# Patient Record
Sex: Female | Born: 1985 | Race: White | Hispanic: No | Marital: Married | Smoking: Former smoker
Health system: Southern US, Community
[De-identification: ages and names within clinical notes are randomized; demographics above are authoritative.]

## PROBLEM LIST (undated history)

## (undated) DIAGNOSIS — G43909 Migraine, unspecified, not intractable, without status migrainosus: Secondary | ICD-10-CM

## (undated) DIAGNOSIS — F32A Depression, unspecified: Secondary | ICD-10-CM

## (undated) DIAGNOSIS — F329 Major depressive disorder, single episode, unspecified: Secondary | ICD-10-CM

## (undated) DIAGNOSIS — F1920 Other psychoactive substance dependence, uncomplicated: Secondary | ICD-10-CM

---
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
## 2003-08-09 ENCOUNTER — Other Ambulatory Visit: Admission: RE | Admit: 2003-08-09 | Discharge: 2003-08-09 | Payer: Self-pay | Admitting: Obstetrics and Gynecology

## 2003-08-10 ENCOUNTER — Other Ambulatory Visit: Admission: RE | Admit: 2003-08-10 | Discharge: 2003-08-10 | Payer: Self-pay | Admitting: Obstetrics and Gynecology

## 2003-12-02 ENCOUNTER — Ambulatory Visit: Payer: Self-pay | Admitting: Psychiatry

## 2003-12-02 ENCOUNTER — Inpatient Hospital Stay (HOSPITAL_COMMUNITY): Admission: RE | Admit: 2003-12-02 | Discharge: 2003-12-07 | Payer: Self-pay | Admitting: Psychiatry

## 2003-12-02 ENCOUNTER — Emergency Department (HOSPITAL_COMMUNITY): Admission: EM | Admit: 2003-12-02 | Discharge: 2003-12-02 | Payer: Self-pay | Admitting: Emergency Medicine

## 2003-12-09 ENCOUNTER — Ambulatory Visit: Payer: Self-pay | Admitting: Family Medicine

## 2003-12-10 ENCOUNTER — Ambulatory Visit: Payer: Self-pay | Admitting: Internal Medicine

## 2004-01-10 ENCOUNTER — Ambulatory Visit: Payer: Self-pay | Admitting: Internal Medicine

## 2004-02-23 ENCOUNTER — Ambulatory Visit: Payer: Self-pay | Admitting: Internal Medicine

## 2004-04-03 ENCOUNTER — Ambulatory Visit: Payer: Self-pay | Admitting: Internal Medicine

## 2004-05-29 ENCOUNTER — Ambulatory Visit: Payer: Self-pay | Admitting: Internal Medicine

## 2004-09-12 ENCOUNTER — Other Ambulatory Visit: Admission: RE | Admit: 2004-09-12 | Discharge: 2004-09-12 | Payer: Self-pay | Admitting: Obstetrics and Gynecology

## 2004-09-13 ENCOUNTER — Other Ambulatory Visit: Admission: RE | Admit: 2004-09-13 | Discharge: 2004-09-13 | Payer: Self-pay | Admitting: Obstetrics and Gynecology

## 2004-11-16 ENCOUNTER — Ambulatory Visit: Payer: Self-pay | Admitting: Internal Medicine

## 2004-12-01 ENCOUNTER — Ambulatory Visit: Payer: Self-pay | Admitting: Internal Medicine

## 2004-12-09 ENCOUNTER — Inpatient Hospital Stay (HOSPITAL_COMMUNITY): Admission: AD | Admit: 2004-12-09 | Discharge: 2004-12-12 | Payer: Self-pay | Admitting: Psychiatry

## 2004-12-10 ENCOUNTER — Ambulatory Visit: Payer: Self-pay | Admitting: Psychiatry

## 2005-01-11 ENCOUNTER — Ambulatory Visit: Payer: Self-pay | Admitting: Internal Medicine

## 2005-02-07 ENCOUNTER — Ambulatory Visit: Payer: Self-pay | Admitting: Family Medicine

## 2005-02-21 ENCOUNTER — Ambulatory Visit: Payer: Self-pay | Admitting: Internal Medicine

## 2005-03-02 ENCOUNTER — Ambulatory Visit: Payer: Self-pay | Admitting: Internal Medicine

## 2005-03-16 ENCOUNTER — Ambulatory Visit: Payer: Self-pay | Admitting: Internal Medicine

## 2005-05-11 ENCOUNTER — Ambulatory Visit: Payer: Self-pay | Admitting: Internal Medicine

## 2005-06-02 ENCOUNTER — Ambulatory Visit: Payer: Self-pay | Admitting: Family Medicine

## 2005-06-05 ENCOUNTER — Ambulatory Visit: Payer: Self-pay | Admitting: Internal Medicine

## 2005-07-12 ENCOUNTER — Ambulatory Visit: Payer: Self-pay | Admitting: Internal Medicine

## 2005-11-12 ENCOUNTER — Ambulatory Visit: Payer: Self-pay | Admitting: Internal Medicine

## 2005-12-18 ENCOUNTER — Ambulatory Visit: Payer: Self-pay | Admitting: Family Medicine

## 2006-05-03 ENCOUNTER — Ambulatory Visit: Payer: Self-pay | Admitting: Internal Medicine

## 2006-05-13 ENCOUNTER — Emergency Department (HOSPITAL_COMMUNITY): Admission: EM | Admit: 2006-05-13 | Discharge: 2006-05-13 | Payer: Self-pay | Admitting: Emergency Medicine

## 2006-07-20 ENCOUNTER — Ambulatory Visit: Payer: Self-pay | Admitting: Family Medicine

## 2006-08-30 ENCOUNTER — Telehealth: Payer: Self-pay | Admitting: Internal Medicine

## 2006-11-06 ENCOUNTER — Ambulatory Visit: Payer: Self-pay | Admitting: Family Medicine

## 2006-11-06 DIAGNOSIS — R109 Unspecified abdominal pain: Secondary | ICD-10-CM | POA: Insufficient documentation

## 2006-11-06 DIAGNOSIS — J309 Allergic rhinitis, unspecified: Secondary | ICD-10-CM | POA: Insufficient documentation

## 2006-11-06 DIAGNOSIS — F3289 Other specified depressive episodes: Secondary | ICD-10-CM | POA: Insufficient documentation

## 2006-11-06 DIAGNOSIS — L708 Other acne: Secondary | ICD-10-CM | POA: Insufficient documentation

## 2006-11-06 LAB — CONVERTED CEMR LAB
Bilirubin Urine: NEGATIVE
Glucose, Urine, Semiquant: NEGATIVE
Ketones, urine, test strip: NEGATIVE
Nitrite: NEGATIVE
Protein, U semiquant: NEGATIVE
Specific Gravity, Urine: <1.005
Urobilinogen, UA: 0.2
WBC Urine, dipstick: NEGATIVE
pH: 7.5

## 2006-11-11 ENCOUNTER — Telehealth: Payer: Self-pay | Admitting: Family Medicine

## 2006-11-20 ENCOUNTER — Inpatient Hospital Stay (HOSPITAL_COMMUNITY): Admission: AD | Admit: 2006-11-20 | Discharge: 2006-11-21 | Payer: Self-pay | Admitting: Obstetrics & Gynecology

## 2006-11-21 ENCOUNTER — Ambulatory Visit: Payer: Self-pay | Admitting: Family Medicine

## 2006-11-26 ENCOUNTER — Ambulatory Visit: Payer: Self-pay | Admitting: Cardiology

## 2007-03-04 ENCOUNTER — Ambulatory Visit: Payer: Self-pay | Admitting: Internal Medicine

## 2007-06-25 ENCOUNTER — Telehealth: Payer: Self-pay | Admitting: *Deleted

## 2007-08-08 ENCOUNTER — Ambulatory Visit: Payer: Self-pay | Admitting: Internal Medicine

## 2007-08-08 LAB — CONVERTED CEMR LAB
Ketones, urine, test strip: NEGATIVE
Nitrite: POSITIVE
Specific Gravity, Urine: <1.005

## 2007-08-09 ENCOUNTER — Encounter: Payer: Self-pay | Admitting: Internal Medicine

## 2007-08-09 LAB — CONVERTED CEMR LAB: Chlamydia, Swab/Urine, PCR: NEGATIVE

## 2007-08-11 ENCOUNTER — Ambulatory Visit: Payer: Self-pay | Admitting: Internal Medicine

## 2007-08-11 ENCOUNTER — Telehealth (INDEPENDENT_AMBULATORY_CARE_PROVIDER_SITE_OTHER): Payer: Self-pay | Admitting: *Deleted

## 2007-08-12 ENCOUNTER — Telehealth (INDEPENDENT_AMBULATORY_CARE_PROVIDER_SITE_OTHER): Payer: Self-pay | Admitting: *Deleted

## 2007-08-12 ENCOUNTER — Encounter: Admission: RE | Admit: 2007-08-12 | Discharge: 2007-08-12 | Payer: Self-pay | Admitting: Internal Medicine

## 2007-08-13 LAB — CONVERTED CEMR LAB
Basophils Absolute: 0.1 10*3/uL (ref 0.0–0.1)
Bilirubin, Direct: 0.1 mg/dL (ref 0.0–0.3)
Calcium: 9.3 mg/dL (ref 8.4–10.5)
Eosinophils Absolute: 0.2 10*3/uL (ref 0.0–0.7)
GFR calc Af Amer: 133 mL/min
GFR calc non Af Amer: 110 mL/min
HCT: 39.3 % (ref 36.0–46.0)
Hemoglobin: 13.6 g/dL (ref 12.0–15.0)
Lymphocytes Relative: 21.8 % (ref 12.0–46.0)
MCHC: 34.5 g/dL (ref 30.0–36.0)
Monocytes Absolute: 0.2 10*3/uL (ref 0.1–1.0)
Neutro Abs: 5.5 10*3/uL (ref 1.4–7.7)
RDW: 11.5 % (ref 11.5–14.6)
Sodium: 138 meq/L (ref 135–145)
Total Bilirubin: 0.8 mg/dL (ref 0.3–1.2)

## 2008-04-16 ENCOUNTER — Ambulatory Visit: Payer: Self-pay | Admitting: Internal Medicine

## 2008-04-16 DIAGNOSIS — J029 Acute pharyngitis, unspecified: Secondary | ICD-10-CM | POA: Insufficient documentation

## 2008-04-16 LAB — CONVERTED CEMR LAB: Rapid Strep: NEGATIVE

## 2008-06-03 ENCOUNTER — Ambulatory Visit: Payer: Self-pay | Admitting: Family Medicine

## 2008-07-14 ENCOUNTER — Telehealth: Payer: Self-pay | Admitting: Family Medicine

## 2008-08-01 IMAGING — CT CT PELVIS W/ CM
2 of 5 series · 17 of 46 positions shown, 19 images · IV contrast (omnipaque)
Comparison: None.

CLINICAL DATA: Abdominal pain, particularly right lower quadrant, some pelvic pain.
 ABDOMEN CT WITH CONTRAST:
TECHNIQUE: Multidetector CT imaging of the abdomen was performed following the standard protocol during bolus administration of intravenous contrast.
 Contrast:  799cc Omnipaque 300
TECHNIQUE: Multidetector CT imaging of the pelvis was performed following the standard protocol during bolus administration of intravenous contrast.

[Series 2: abd_pel 5.0 b30f st · axial · 0.64mm/px · z∈[-390,-16]mm · 14 of 85 slices shown, 16 images]
[im 5/85  soft-tissue]
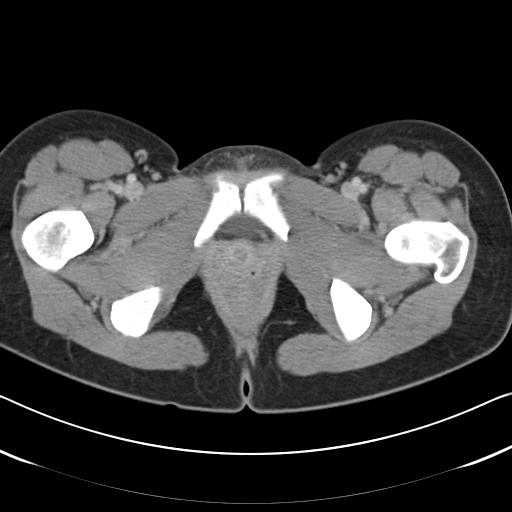
[im 5/85  bone]
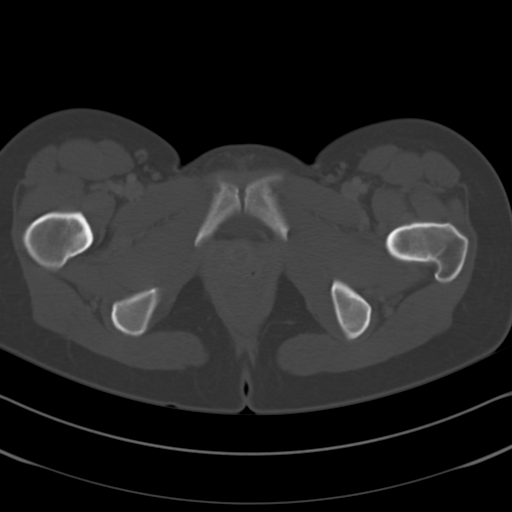
[im 13/85  soft-tissue]
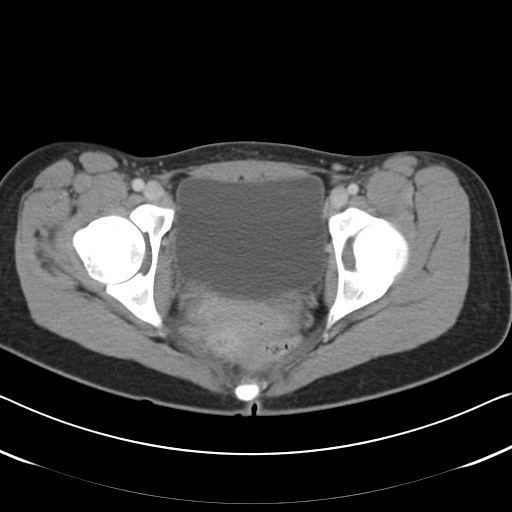
[im 17/85  soft-tissue]
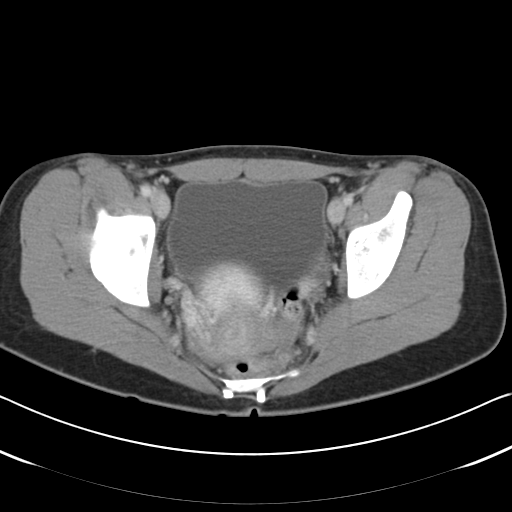
[im 22/85  soft-tissue]
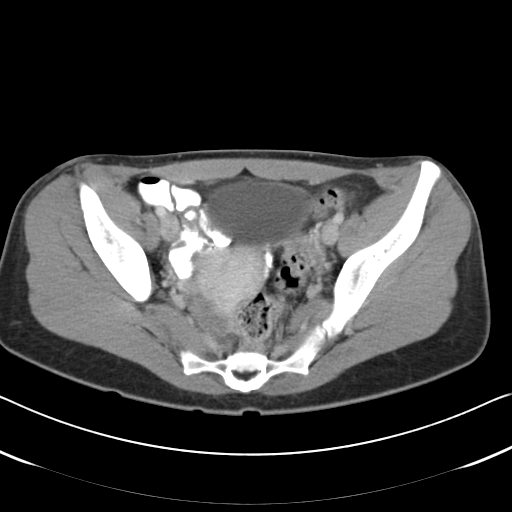
[im 30/85  soft-tissue]
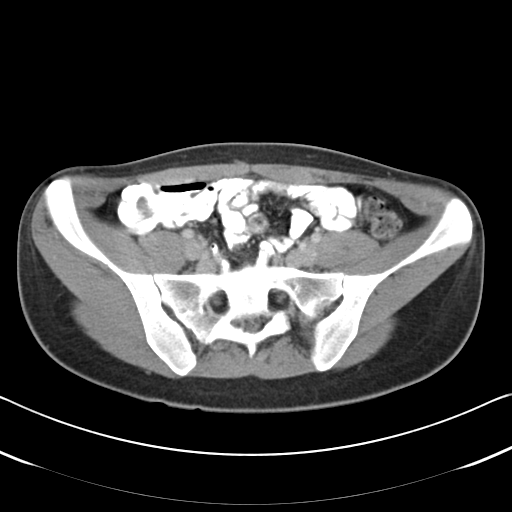
[im 34/85  soft-tissue]
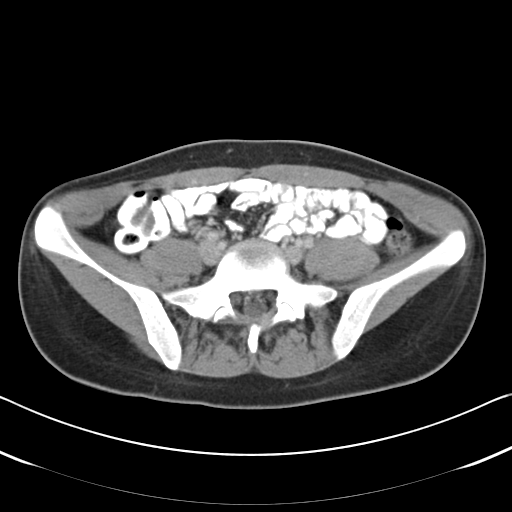
[im 38/85  soft-tissue]
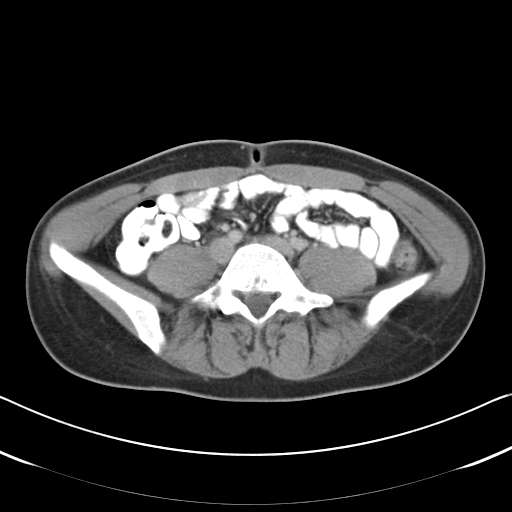
[im 47/85  soft-tissue]
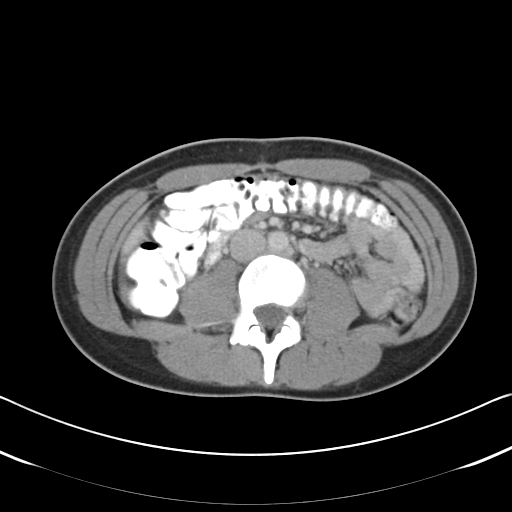
[im 51/85  soft-tissue]
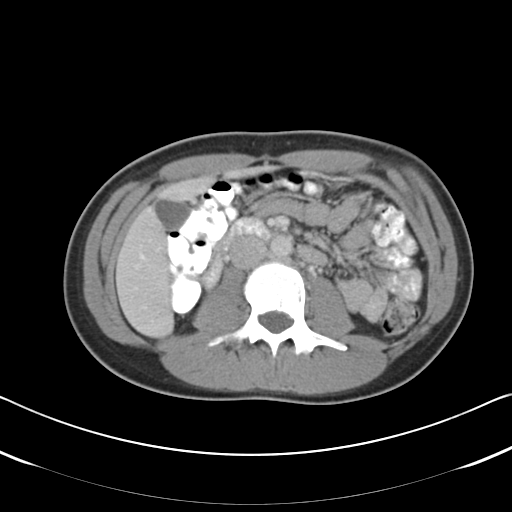
[im 51/85  bone]
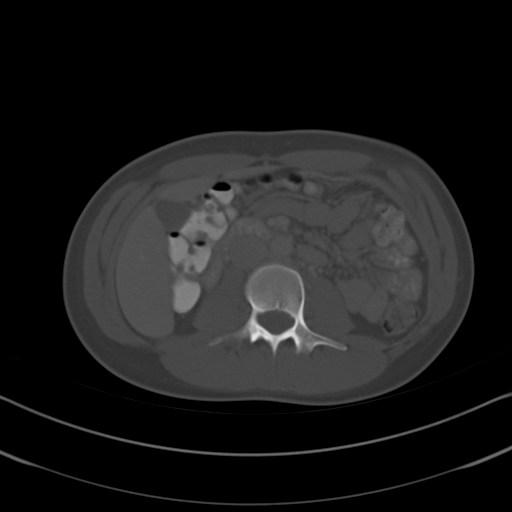
[im 55/85  soft-tissue]
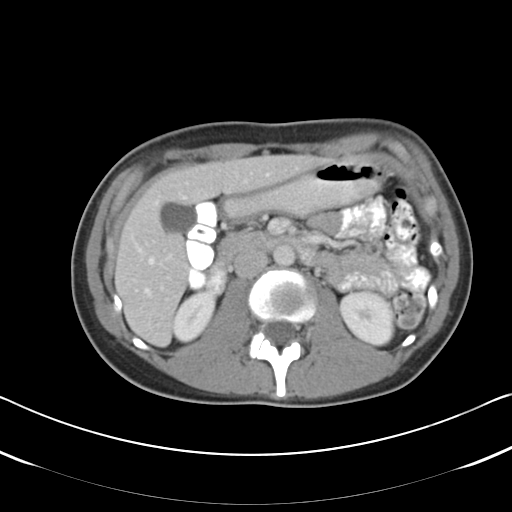
[im 64/85  soft-tissue]
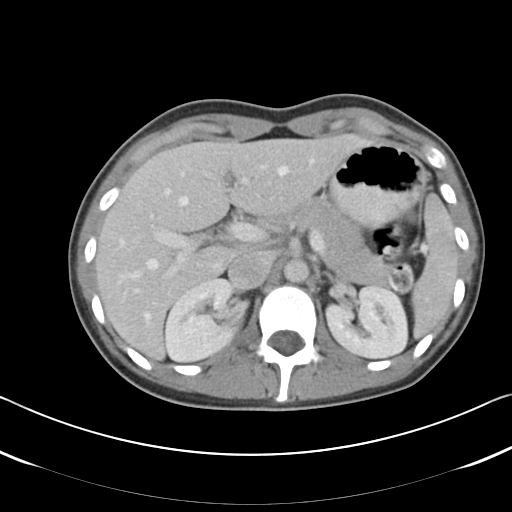
[im 68/85  soft-tissue]
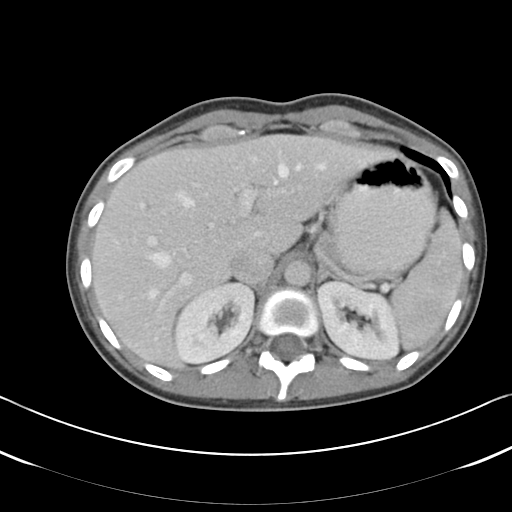
[im 72/85  soft-tissue]
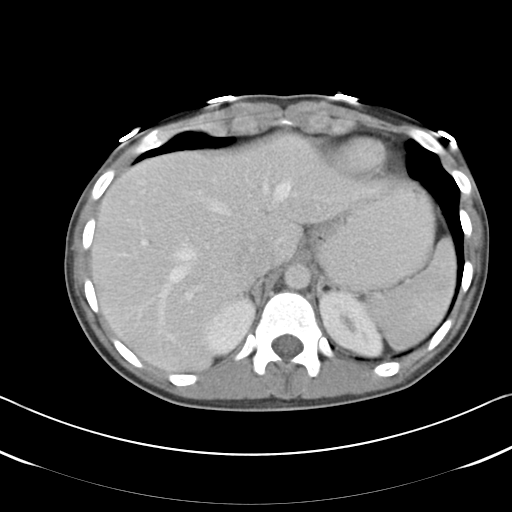
[im 80/85  soft-tissue]
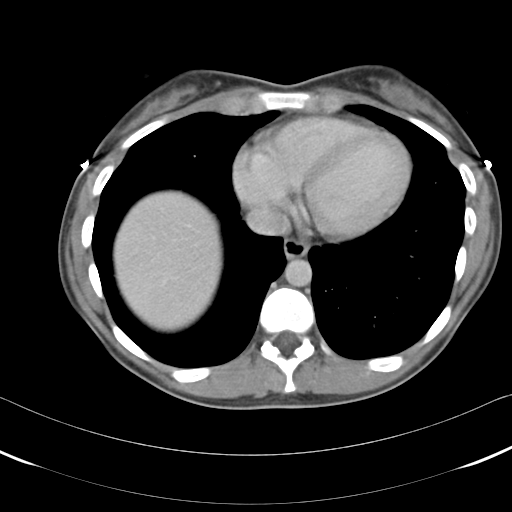

[Series 602: <mpr thick range> · coronal · 0.84mm/px · 3 of 63 slices shown]
[im 21/63  soft-tissue]
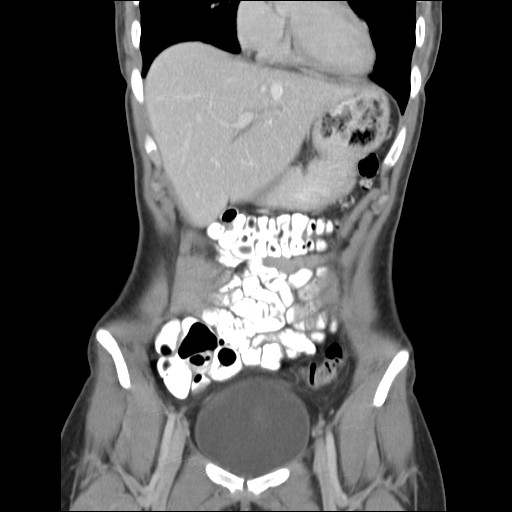
[im 28/63  soft-tissue]
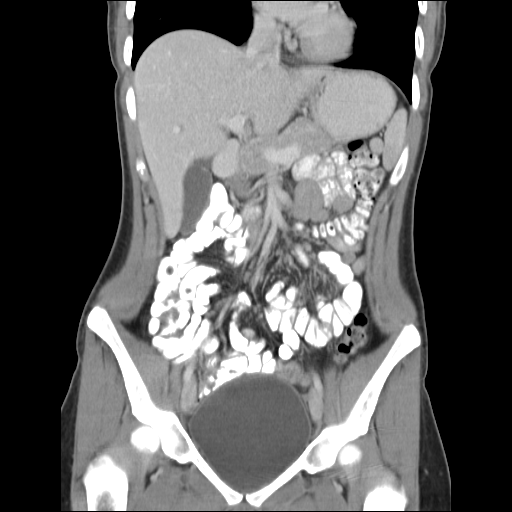
[im 35/63  soft-tissue]
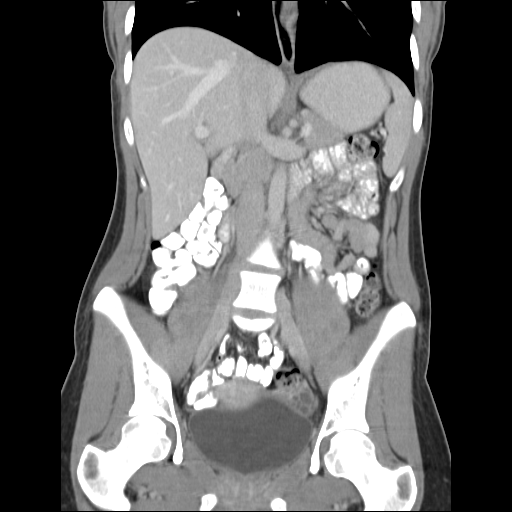

[17 of 46 positions shown; findings below may reference images not displayed]

FINDINGS: The lung bases are clear. The liver enhances with no focal abnormality and no ductal dilatation is seen. No calcified gallstones are noted. The pancreas is normal in size and the pancreatic duct is not dilated. The adrenal glands and spleen appear normal. The kidneys enhance and on delayed images, the pelvocaliceal systems appear normal. The abdominal aorta is normal in caliber.
IMPRESSION: Negative CT of the abdomen.
 PELVIS CT WITH CONTRAST:
FINDINGS: The appendix is relatively well seen low in the right bony pelvis, and appear normal.  There is no CT evidence of appendicitis.  Terminal ileum appears grossly normal as well. Urinary bladder is unremarkable. The uterus is normal in size. Multiple left ovarian follicles are present. There is some free fluid in the pelvis possibly due to recently ruptured ovarian cyst.
IMPRESSION: 1.  The appendix appears normal.
 2.  Small to moderate amount of fluid in pelvis possibly due to recently ruptured ovarian cyst.

## 2008-09-25 ENCOUNTER — Ambulatory Visit: Payer: Self-pay | Admitting: Internal Medicine

## 2008-09-25 LAB — CONVERTED CEMR LAB: Rapid Strep: NEGATIVE

## 2008-11-01 ENCOUNTER — Ambulatory Visit: Payer: Self-pay | Admitting: Internal Medicine

## 2008-11-01 DIAGNOSIS — R519 Headache, unspecified: Secondary | ICD-10-CM | POA: Insufficient documentation

## 2008-11-01 DIAGNOSIS — M542 Cervicalgia: Secondary | ICD-10-CM | POA: Insufficient documentation

## 2008-11-01 DIAGNOSIS — R51 Headache: Secondary | ICD-10-CM | POA: Insufficient documentation

## 2008-11-09 ENCOUNTER — Encounter: Payer: Self-pay | Admitting: Internal Medicine

## 2008-11-22 ENCOUNTER — Ambulatory Visit: Payer: Self-pay | Admitting: Internal Medicine

## 2008-11-22 DIAGNOSIS — IMO0002 Reserved for concepts with insufficient information to code with codable children: Secondary | ICD-10-CM | POA: Insufficient documentation

## 2008-11-24 ENCOUNTER — Telehealth: Payer: Self-pay | Admitting: Internal Medicine

## 2008-11-26 ENCOUNTER — Telehealth: Payer: Self-pay | Admitting: Internal Medicine

## 2008-11-30 ENCOUNTER — Telehealth: Payer: Self-pay | Admitting: Internal Medicine

## 2008-12-01 ENCOUNTER — Encounter: Admission: RE | Admit: 2008-12-01 | Discharge: 2009-01-19 | Payer: Self-pay | Admitting: Internal Medicine

## 2008-12-06 ENCOUNTER — Ambulatory Visit: Payer: Self-pay | Admitting: Internal Medicine

## 2008-12-06 DIAGNOSIS — J069 Acute upper respiratory infection, unspecified: Secondary | ICD-10-CM | POA: Insufficient documentation

## 2008-12-06 DIAGNOSIS — R300 Dysuria: Secondary | ICD-10-CM | POA: Insufficient documentation

## 2008-12-06 LAB — CONVERTED CEMR LAB
Bilirubin Urine: NEGATIVE
Ketones, urine, test strip: NEGATIVE
Nitrite: NEGATIVE
Specific Gravity, Urine: <1.005

## 2008-12-13 ENCOUNTER — Telehealth: Payer: Self-pay | Admitting: *Deleted

## 2008-12-13 ENCOUNTER — Ambulatory Visit: Payer: Self-pay | Admitting: Internal Medicine

## 2008-12-13 DIAGNOSIS — T50995A Adverse effect of other drugs, medicaments and biological substances, initial encounter: Secondary | ICD-10-CM | POA: Insufficient documentation

## 2008-12-13 LAB — CONVERTED CEMR LAB
Bilirubin Urine: NEGATIVE
Glucose, Urine, Semiquant: NEGATIVE
Urobilinogen, UA: 0.2
WBC Urine, dipstick: NEGATIVE

## 2008-12-14 ENCOUNTER — Encounter: Payer: Self-pay | Admitting: Internal Medicine

## 2009-01-26 ENCOUNTER — Encounter: Payer: Self-pay | Admitting: Internal Medicine

## 2009-02-17 ENCOUNTER — Ambulatory Visit: Payer: Self-pay | Admitting: Internal Medicine

## 2009-02-17 DIAGNOSIS — J050 Acute obstructive laryngitis [croup]: Secondary | ICD-10-CM | POA: Insufficient documentation

## 2009-02-22 ENCOUNTER — Telehealth: Payer: Self-pay | Admitting: *Deleted

## 2009-02-23 ENCOUNTER — Ambulatory Visit: Payer: Self-pay | Admitting: Internal Medicine

## 2009-03-03 ENCOUNTER — Telehealth: Payer: Self-pay | Admitting: Family Medicine

## 2009-03-03 ENCOUNTER — Ambulatory Visit: Payer: Self-pay | Admitting: Family Medicine

## 2009-03-04 LAB — CONVERTED CEMR LAB
Basophils Absolute: 0 10*3/uL (ref 0.0–0.1)
Eosinophils Absolute: 0.4 10*3/uL (ref 0.0–0.7)
Eosinophils Relative: 5.1 % — ABNORMAL HIGH (ref 0.0–5.0)
Lymphs Abs: 2.3 10*3/uL (ref 0.7–4.0)
MCV: 97.5 fL (ref 78.0–100.0)
Monocytes Absolute: 0.2 10*3/uL (ref 0.1–1.0)
Neutrophils Relative %: 64.9 % (ref 43.0–77.0)
Platelets: 379 10*3/uL (ref 150.0–400.0)
RDW: 12.1 % (ref 11.5–14.6)
WBC: 8.3 10*3/uL (ref 4.5–10.5)

## 2009-03-21 ENCOUNTER — Encounter: Payer: Self-pay | Admitting: Internal Medicine

## 2009-04-11 ENCOUNTER — Ambulatory Visit: Payer: Self-pay | Admitting: Internal Medicine

## 2009-04-11 DIAGNOSIS — R1031 Right lower quadrant pain: Secondary | ICD-10-CM | POA: Insufficient documentation

## 2009-04-11 DIAGNOSIS — R112 Nausea with vomiting, unspecified: Secondary | ICD-10-CM | POA: Insufficient documentation

## 2009-04-11 LAB — CONVERTED CEMR LAB
AST: 51 units/L — ABNORMAL HIGH (ref 0–37)
Albumin: 4.2 g/dL (ref 3.5–5.2)
Alkaline Phosphatase: 52 units/L (ref 39–117)
Basophils Absolute: 0.1 10*3/uL (ref 0.0–0.1)
Bilirubin Urine: NEGATIVE
Bilirubin, Direct: 0 mg/dL (ref 0.0–0.3)
CO2: 28 meq/L (ref 19–32)
Calcium: 9 mg/dL (ref 8.4–10.5)
Eosinophils Relative: 1.4 % (ref 0.0–5.0)
Glucose, Bld: 90 mg/dL (ref 70–99)
Glucose, Urine, Semiquant: NEGATIVE
Lymphocytes Relative: 23.7 % (ref 12.0–46.0)
Lymphs Abs: 1.7 10*3/uL (ref 0.7–4.0)
Monocytes Relative: 6.9 % (ref 3.0–12.0)
Neutrophils Relative %: 67.2 % (ref 43.0–77.0)
Platelets: 303 10*3/uL (ref 150.0–400.0)
Potassium: 3.3 meq/L — ABNORMAL LOW (ref 3.5–5.1)
Protein, U semiquant: NEGATIVE
RDW: 11.8 % (ref 11.5–14.6)
Sodium: 140 meq/L (ref 135–145)
Specific Gravity, Urine: 1.01
Total Protein: 7.9 g/dL (ref 6.0–8.3)
WBC Urine, dipstick: NEGATIVE
WBC: 7.1 10*3/uL (ref 4.5–10.5)
pH: 7

## 2009-05-10 ENCOUNTER — Ambulatory Visit: Payer: Self-pay | Admitting: Internal Medicine

## 2009-05-16 ENCOUNTER — Telehealth: Payer: Self-pay | Admitting: *Deleted

## 2009-06-21 ENCOUNTER — Ambulatory Visit: Payer: Self-pay | Admitting: Internal Medicine

## 2009-06-21 DIAGNOSIS — N925 Other specified irregular menstruation: Secondary | ICD-10-CM | POA: Insufficient documentation

## 2009-06-21 DIAGNOSIS — N938 Other specified abnormal uterine and vaginal bleeding: Secondary | ICD-10-CM | POA: Insufficient documentation

## 2009-06-21 DIAGNOSIS — N760 Acute vaginitis: Secondary | ICD-10-CM | POA: Insufficient documentation

## 2009-06-21 DIAGNOSIS — N949 Unspecified condition associated with female genital organs and menstrual cycle: Secondary | ICD-10-CM

## 2009-06-24 ENCOUNTER — Telehealth: Payer: Self-pay | Admitting: Internal Medicine

## 2010-02-19 ENCOUNTER — Encounter: Payer: Self-pay | Admitting: Family Medicine

## 2010-02-28 NOTE — Progress Notes (Signed)
Summary: still having vaginal pain p pt k saw  Call back at [facility] Phone [phone]   Summary of Call: Bleeding has subsides some, still having some bleeding mostly spotting and some discharge little bit,  but the vaginal pain & irritation is not better.  Dr. Kirtland Bouchard told her to call back.  Using Provera & Nystatin that he gave.  No fever. Taking Naproxen for the pain, helps some.   Unable to see OB Gyn.  Is going out of town also.  Needs your advice.  [facility].  Allergic to sulfa.    Initial call taken by: Rudy Jew, RN,  Jun 24, 2009 9:55 AM    New/Updated Medications: FLUCONAZOLE 150 MG TABS (FLUCONAZOLE) One dose Prescriptions: FLUCONAZOLE 150 MG TABS (FLUCONAZOLE) One dose  #1 x 9   Entered by:   Rudy Jew, RN   Authorized by:   Gordy Savers  MD   Signed by:   Rudy Jew, RN on 06/24/2009   Method used:   [facility]ron[facility] to        [facility] [phone]* (retail)       [address] [place], [place]  [address]       Ph: [phone]       Fax: [phone]   RxID:   [id number]  please call in generic diflucan 150 mg  #1 one dose

## 2010-02-28 NOTE — Progress Notes (Signed)
Summary: refill on hydrocodone/homtropine syrup  Phone Note From Pharmacy   Caller: [facility] Reason for Call: Needs renewal Details for Reason: Hydrocodone/Homatroppine syrup Summary of Call: last filled on 05/10/09 #181ml Initial call taken by: Romualdo Bolk, CMA (AAMA),  May 16, 2009 11:37 AM  Follow-up for Phone Call        ok to refill x 1  Follow-up by: Madelin Headings MD,  May 16, 2009 12:43 PM  Additional Follow-up for Phone Call Additional follow up Details #1::        Rx faxed to pharmacy. Additional Follow-up by: Romualdo Bolk, CMA (AAMA),  May 16, 2009 1:10 PM    Prescriptions: HYDROCODONE-HOMATROPINE 5-1.5 MG/5ML SYRP (HYDROCODONE-HOMATROPINE) 1-2 tsp by mouth q 4-6 hour as needed  cough  #6 oz x 0   Entered by:   Romualdo Bolk, CMA (AAMA)   Authorized by:   Madelin Headings MD   Signed by:   Romualdo Bolk, CMA (AAMA) on 05/16/2009   Method used:   Handwritten   RxID:   [id number]

## 2010-02-28 NOTE — Miscellaneous (Signed)
Summary: Discharge Summary for PT Services/[facility]  Discharge Summary for PT Services/[facility]   Imported By: Maryln Gottron 02/04/2009 09:28:16  _____________________________________________________________________  External Attachment:    Type:   Image     Comment:   External Document

## 2010-02-28 NOTE — Progress Notes (Signed)
Summary: not feeling better-PLEASE TRIAGE  Phone Note Call from Patient Call back at Home Phone [phone]   Caller: [facility] mail Reason for Call: Talk to Nurse Summary of Call: Was seen twicr last week with an uri with a fever. Was doing a little better this week, until last night. Now she has a fever, stomach issues. Please return her call. Initial call taken by: Warnell Forester,  March 03, 2009 1:07 PM  Follow-up for Phone Call        Pt was seen by Dr. Caryl Never yesterday.  Old phone note. Follow-up by: Lynann Beaver CMA,  March 04, 2009 8:26 AM
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
## 2010-02-28 NOTE — Progress Notes (Signed)
Summary: symptoms are worse  Phone Note Call from Patient   Caller: Patient Call For: Madelin Headings MD Summary of Call: Pt calls to let Dr. Fabian Sharp know she has become worse with her cough since her office visit, and is running a fever off an on again.  Was told to call if she felt worse. [phone] Initial call taken by: Lynann Beaver CMA,  February 22, 2009 1:03 PM  Follow-up for Phone Call        Pharmacy also called wanting a refill on hydrocodone syrup. Can we refill? Follow-up by: Romualdo Bolk, CMA Duncan Dull),  February 22, 2009 2:32 PM  Additional Follow-up for Phone Call Additional follow up Details #1::        ok to refill the cough medicine. How much fever ?over 100 ? any shortness of breath  consider getting chest x ray and and follow up  Additional Follow-up by: Madelin Headings MD,  February 22, 2009 3:02 PM    Additional Follow-up for Phone Call Additional follow up Details #2::    Pt aware and rx faxed back to the pharmacy. Order put in for cxr and appt made for 1/26. Follow-up by: Romualdo Bolk, CMA (AAMA),  February 22, 2009 4:26 PM  Prescriptions: HYDROMET 5-1.5 MG/5ML SYRP (HYDROCODONE-HOMATROPINE) 1-2 tsp  every 4-6 hours for cough  #6 x 0   Entered by:   Romualdo Bolk, CMA (AAMA)   Authorized by:   Madelin Headings MD   Signed by:   Romualdo Bolk, CMA (AAMA) on 02/22/2009   Method used:   Handwritten   RxID:   [id number]   Appended Document: symptoms are worse Pt states that temp was 101.0 last pm and today. She is also wheezing.

## 2010-02-28 NOTE — Letter (Signed)
Summary: Out of Work  [facility] [employer]  [address]   [place], [place] [address]   Phone: [phone]  Fax: [phone]         March 03, 2009   Employee:  MONEY MCKEITHAN    To Whom It May Concern:   For Medical reasons, please excuse the above named employee from work for the following dates:  Start:   03/03/2009  End:   03/03/2009  If you need additional information, please feel free to contact our office.         Sincerely,         Evelena Peat, MD
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
## 2010-02-28 NOTE — Assessment & Plan Note (Signed)
Summary: vaginal pain/dm   Vital Signs:  Patient profile:   25 year old female Menstrual status:  regular Weight:      132 pounds Temp:     98.2 degrees F oral BP sitting:   118 / 80  (left arm) Cuff size:   regular  Vitals Entered By: Duard Brady LPN (Jun 21, 2009 3:40 PM) CC: c/o vaginal pain and burning - active menses , migraine last week ,rec'd 'trigger injections' for it Is Patient Diabetic? No   CC:  c/o vaginal pain and burning - active menses , migraine last week , and rec'd 'trigger injections' for it.  History of Present Illness: 25 year old patient who presents with a chief complaint of vaginal pain and burning.  She saw her gynecologist approximately 6 weeks ago and BCPs were changed to continuous treatment rather than cyclic in attempt to control her migraine headaches.  For the past 4 weeks.  She states that she has had daily vaginal bleeding.  For the past week.  She has had what she described as vaginal pain.  She is in a monogamous relationship with her fianc.  Apparently, she had sexual intercourse 5 days ago without difficulty.  Due to the pain and burning, she has been using Monistat cream. There is no history of fever, vaginal discharge or history of STDs.  She states that both her boyfriend and herself were checked for STD prior to starting an intimate relationship.  Preventive Screening-Counseling & Management  Alcohol-Tobacco     Smoking Status: never  Allergies: 1)  ! Sulfa  Social History: Smoking Status:  never  Physical Exam  General:  Well-developed,well-nourished,in no acute distress; alert,appropriate and cooperative throughout examination; anxious;  normal blood pressure Abdomen:  Bowel sounds positive,abdomen soft and non-tender without masses, organomegaly or hernias noted. Genitalia:  the labia appeared normal externally; attempts to pass the speculum for internal examination terminated due to pain at the introitus.  Physical  examination was uncomfortable at the introitus, but cervical palpation did not tend to aggravate pain   Impression & Recommendations:  Problem # 1:  VULVITIS (ICD-616.10) the clinical examination was grossly fairly unremarkable except for the pain at the introitus.  The patient has been using Monistat and perhaps this is a partially treated vulvitis.    Problem # 2:  OTH D/O MENSTRUATION&OTH ABN BLEED FE GNT TRACT (ICD-626.8) will hold BCPs and treat with Provera for 10 days; follow-up GYN  Complete Medication List: 1)  Imitrex 100 Mg Tabs (Sumatriptan succinate) 2)  Ortho-cyclen (28) 0.25-35 Mg-mcg Tabs (Norgestimate-eth estradiol) 3)  Naprosyn 500 Mg Tabs (Naproxen) .Marland Kitchen.. 1 by mouth two times a day for back strain 4)  Imipramine Hcl 50 Mg Tabs (Imipramine hcl) .Marland Kitchen.. 1 by mouth once daily 5)  Ventolin Hfa 108 (90 Base) Mcg/act Aers (Albuterol sulfate) .... 2 puffs   q  6 hours  as needed wheezing 6)  Promethazine Hcl 25 Mg Tabs (Promethazine hcl) .... One by mouth q 6 hours as needed nausea and vomiting 7)  Ondansetron 4 Mg Tbdp (Ondansetron) .Marland Kitchen.. 1 by mouth q4-6 hours as needed nausea and vomiting 8)  Nystatin-triamcinolone 100000-0.1 Unit/gm-% Crea (Nystatin-triamcinolone) .... Use  twice-daily 9)  Medroxyprogesterone Acetate 10 Mg Tabs (Medroxyprogesterone acetate) .... One daily for 10 days  Patient Instructions: 1)  gynecologic follow-up as discussed 2)  hold birth control pills 3)  Use Provera daily for 10 days, and then resume her control pills 4)  apply  vaginal cream twice daily Prescriptions:  MEDROXYPROGESTERONE ACETATE 10 MG TABS (MEDROXYPROGESTERONE ACETATE) one daily for 10 days  #10 x 0   Entered and Authorized by:   Gordy Savers  MD   Signed by:   Gordy Savers  MD on 06/21/2009   Method used:   Print then Give to Patient   RxID:   [id number] NYSTATIN-TRIAMCINOLONE 100000-0.1 UNIT/GM-% CREA (NYSTATIN-TRIAMCINOLONE) use  twice-daily  #60 gm x 0    Entered and Authorized by:   Gordy Savers  MD   Signed by:   Gordy Savers  MD on 06/21/2009   Method used:   Print then Give to Patient   RxID:   [id number] MEDROXYPROGESTERONE ACETATE 10 MG TABS (MEDROXYPROGESTERONE ACETATE) one daily for 10 days  #10 x 0   Entered and Authorized by:   Gordy Savers  MD   Signed by:   Gordy Savers  MD on 06/21/2009   Method used:   Electronically to        [facility] # [phone]* (retail)       [address] [place], [place]  [address]       Ph: [phone]       Fax: [phone]   RxID:   [id number] NYSTATIN-TRIAMCINOLONE 100000-0.1 UNIT/GM-% CREA (NYSTATIN-TRIAMCINOLONE) use  twice-daily  #60 gm x 0   Entered and Authorized by:   Gordy Savers  MD   Signed by:   Gordy Savers  MD on 06/21/2009   Method used:   Electronically to        [facility] # [phone]* (retail)       [address] [place], [place]  [address]       Ph: [phone]       Fax: [phone]   RxID:   [id number]
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
## 2010-06-13 NOTE — Assessment & Plan Note (Signed)
[facility]                                 ON-CALL NOTE   Catherine Joyce, Catherine Joyce                         MRN:          [id number]  DATE:07/20/2006                            DOB:          April 17, 1985    Patient of Dr. Fabian Sharp called this morning during [facility]  complaining of UTI symptoms for 2 days and a rash.  She is coming in  this morning to be seen in Saturday clinic.     Lelon Perla, DO  Electronically Signed    Shawnie Dapper  DD: 07/20/2006  DT: 07/20/2006  Job #: [id number]   cc:   Neta Mends. Fabian Sharp, MD
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
## 2010-06-16 NOTE — Op Note (Signed)
   NAME:  Catherine Joyce, Catherine Joyce                          ACCOUNT NO.:  [id number]   MEDICAL RECORD NO.:  [id number]                   PATIENT TYPE:  AMB   LOCATION:  DSC                                  FACILITY:  [facility]   PHYSICIAN:  Rose Phi. Maple Hudson, M.D.                DATE OF BIRTH:  04/13/1985   DATE OF PROCEDURE:  09/03/2002  DATE OF DISCHARGE:                                 OPERATIVE REPORT   PREOPERATIVE DIAGNOSIS:  Fibroadenoma of the left breast.   POSTOPERATIVE DIAGNOSIS:  Fibroadenoma of the left breast.   OPERATION PERFORMED:  Excision of the same.   SURGEON:  Rose Phi. Maple Hudson, M.D.   ANESTHESIA:  MAC.   DESCRIPTION OF PROCEDURE:  The patient was placed on the operating table  with the left arm extended on the arm board.  The palpable nodule was right  at the 3 o'clock position.  A little curved incision was outlined over it  and after prepping and draping the area it was infiltrated with 1% Xylocaine  with Adrenalin.  Incision was made and we exposed the nodular tissue and put  a suture in it for traction purposes and then excised this.  Hemostasis was  obtained with the cautery.  Subcuticular closure with 4-0 Monocryl and Steri-  Strips carried out.  Dressing applied.  The patient was then transferred to  the recovery room in satisfactory condition having tolerated the procedure  well.                                                Rose Phi. Maple Hudson, M.D.    PRY/MEDQ  D:  09/03/2002  T:  09/03/2002  Job:  [id number]
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
## 2010-11-08 LAB — URINALYSIS, ROUTINE W REFLEX MICROSCOPIC
Glucose, UA: NEGATIVE
Ketones, ur: NEGATIVE
Leukocytes, UA: NEGATIVE
Nitrite: NEGATIVE
Specific Gravity, Urine: <1.005 — ABNORMAL LOW
pH: 6

## 2010-11-08 LAB — POCT PREGNANCY, URINE: Operator id: 114931

## 2010-11-08 LAB — GC/CHLAMYDIA PROBE AMP, GENITAL
Chlamydia, DNA Probe: NEGATIVE
GC Probe Amp, Genital: NEGATIVE

## 2010-11-08 LAB — URINE MICROSCOPIC-ADD ON

## 2016-01-17 ENCOUNTER — Ambulatory Visit (INDEPENDENT_AMBULATORY_CARE_PROVIDER_SITE_OTHER): Payer: BLUE CROSS/BLUE SHIELD | Admitting: Psychology

## 2016-01-17 DIAGNOSIS — F1120 Opioid dependence, uncomplicated: Secondary | ICD-10-CM | POA: Diagnosis not present

## 2016-01-17 DIAGNOSIS — F1994 Other psychoactive substance use, unspecified with psychoactive substance-induced mood disorder: Secondary | ICD-10-CM

## 2016-01-17 DIAGNOSIS — F4310 Post-traumatic stress disorder, unspecified: Secondary | ICD-10-CM
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
## 2016-01-18 ENCOUNTER — Other Ambulatory Visit (HOSPITAL_COMMUNITY): Payer: BLUE CROSS/BLUE SHIELD | Attending: Psychiatry | Admitting: Psychology

## 2016-01-18 ENCOUNTER — Encounter (HOSPITAL_COMMUNITY): Payer: Self-pay | Admitting: Psychology

## 2016-01-18 DIAGNOSIS — Z79899 Other long term (current) drug therapy: Secondary | ICD-10-CM | POA: Insufficient documentation

## 2016-01-18 DIAGNOSIS — Z8659 Personal history of other mental and behavioral disorders: Secondary | ICD-10-CM | POA: Insufficient documentation

## 2016-01-18 DIAGNOSIS — G894 Chronic pain syndrome: Secondary | ICD-10-CM | POA: Insufficient documentation

## 2016-01-18 DIAGNOSIS — F1120 Opioid dependence, uncomplicated: Secondary | ICD-10-CM | POA: Insufficient documentation

## 2016-01-18 DIAGNOSIS — F17210 Nicotine dependence, cigarettes, uncomplicated: Secondary | ICD-10-CM | POA: Insufficient documentation

## 2016-01-18 DIAGNOSIS — Z6372 Alcoholism and drug addiction in family: Secondary | ICD-10-CM | POA: Insufficient documentation

## 2016-01-18 DIAGNOSIS — F1994 Other psychoactive substance use, unspecified with psychoactive substance-induced mood disorder: Secondary | ICD-10-CM

## 2016-01-18 DIAGNOSIS — F3289 Other specified depressive episodes: Secondary | ICD-10-CM | POA: Insufficient documentation

## 2016-01-18 DIAGNOSIS — F411 Generalized anxiety disorder: Secondary | ICD-10-CM | POA: Insufficient documentation

## 2016-01-18 DIAGNOSIS — F1520 Other stimulant dependence, uncomplicated: Secondary | ICD-10-CM | POA: Insufficient documentation

## 2016-01-18 DIAGNOSIS — F4310 Post-traumatic stress disorder, unspecified: Secondary | ICD-10-CM | POA: Insufficient documentation

## 2016-01-18 DIAGNOSIS — Z882 Allergy status to sulfonamides status: Secondary | ICD-10-CM | POA: Insufficient documentation

## 2016-01-19 ENCOUNTER — Other Ambulatory Visit (HOSPITAL_COMMUNITY): Payer: BLUE CROSS/BLUE SHIELD | Admitting: Psychology

## 2016-01-19 DIAGNOSIS — F1120 Opioid dependence, uncomplicated: Secondary | ICD-10-CM

## 2016-01-20 NOTE — Progress Notes (Signed)
Daily Group Progress Note  Program: CD-IOP   01/20/2016 Catherine Joyce [id number]  Diagnosis:  Opioid use disorder, severe, dependence (HCC)  Substance induced mood disorder (HCC)  PTSD (post-traumatic stress disorder)   Sobriety Date: 12/03/15  Group Time: 1-2:30  Participation Level: Active  Behavioral Response: Appropriate and Sharing  Type of Therapy: Process Group  Interventions: CBT  Topic: met with patients for 1.5 hour group process session and discussed recovery from mind-altering drugs, cravings, strategies, and resources for sobriety. Pts were active and engaged in discussion. 1 new group member was present and shared briefly and offered feedback to others. One group member became markedly tearful when discussing her recent alcoholism. UDS were collected from some members. Some members met with Charles Kober, program director.     Group Time: 2:30-4  Participation Level: Active  Behavioral Response: Appropriate and Sharing  Type of Therapy: Psycho-education Group  Interventions: Assertiveness Training  Topic: Met with patients for 1.5 hour group psychoeducation session with an emphasis on AA, 12 step knowledge, and how to pursue early recovery. Patients were all active and engaged in session. Counselor offered strategies for relapse prevention, calling friends, and reaching out for help at meetings.   Summary: Patient was active and engaged in group. She presented as confident, expressive, and insightful. She shared about her hx of addiction and how it caused her to lose her job, be a "bad mom", and spend too much money. Pt offered feedback to other group members which was helpful and insightful, and well received. She discussed her hx of suicide attempts, hospitalizations, and attempts at sobriety. Patient appeared grateful to be in CD-IOP and excited to begin tx. Catherine Joyce, LPCA   UDS collected: Yes Results: Pending  AA/NA attended?: Yes  Sponsor?:  Yes    , LCAS 01/20/2016 10:57 AM 

## 2016-01-25 ENCOUNTER — Other Ambulatory Visit (HOSPITAL_COMMUNITY): Payer: BLUE CROSS/BLUE SHIELD | Admitting: Psychology

## 2016-01-25 ENCOUNTER — Encounter (HOSPITAL_COMMUNITY): Payer: Self-pay | Admitting: Medical

## 2016-01-25 ENCOUNTER — Encounter (HOSPITAL_COMMUNITY): Payer: Self-pay | Admitting: Psychology

## 2016-01-25 VITALS — BP 110/70 | HR 66 | Ht 65.0 in | Wt 134.0 lb

## 2016-01-25 DIAGNOSIS — Z8659 Personal history of other mental and behavioral disorders: Secondary | ICD-10-CM

## 2016-01-25 DIAGNOSIS — F411 Generalized anxiety disorder: Secondary | ICD-10-CM

## 2016-01-25 DIAGNOSIS — F4310 Post-traumatic stress disorder, unspecified: Secondary | ICD-10-CM

## 2016-01-25 DIAGNOSIS — F1520 Other stimulant dependence, uncomplicated: Secondary | ICD-10-CM

## 2016-01-25 DIAGNOSIS — G894 Chronic pain syndrome: Secondary | ICD-10-CM

## 2016-01-25 DIAGNOSIS — F1120 Opioid dependence, uncomplicated: Secondary | ICD-10-CM

## 2016-01-25 DIAGNOSIS — F3289 Other specified depressive episodes: Secondary | ICD-10-CM

## 2016-01-25 DIAGNOSIS — Z6372 Alcoholism and drug addiction in family: Secondary | ICD-10-CM

## 2016-01-25 DIAGNOSIS — F17210 Nicotine dependence, cigarettes, uncomplicated: Secondary | ICD-10-CM

## 2016-01-25 MED ORDER — NALTREXONE HCL 50 MG PO TABS: 50.0000 mg | ORAL_TABLET | Freq: Every day | ORAL | 2 refills | Status: DC

## 2016-01-25 MED ORDER — NICOTINE 7 MG/24HR TD PT24: 7.0000 mg | MEDICATED_PATCH | Freq: Every day | TRANSDERMAL | 0 refills | Status: DC

## 2016-01-25 MED ORDER — GABAPENTIN 300 MG PO CAPS: 300.0000 mg | ORAL_CAPSULE | Freq: Two times a day (BID) | ORAL | 2 refills | Status: DC

## 2016-01-25 MED ORDER — CLONIDINE HCL 0.2 MG PO TABS: 0.2000 mg | ORAL_TABLET | Freq: Every day | ORAL | 2 refills | Status: DC

## 2016-01-25 NOTE — Progress Notes (Signed)
Catherine Joyce is a 30 y.o. female patient. CD-IOP: The patient is entering the CD-IOP. She will be a consortium patient and received treatment under the terms of the consortium with [facility] and other local providers.         Charmian MuffAnn Cate Joyce, LCAS
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
## 2016-01-25 NOTE — Progress Notes (Signed)
    Daily Group Progress Note  Program: CD-IOP   01/25/2016 Randa NgoJane Powell [id number]  Diagnosis:  No diagnosis found.   Sobriety Date: 11/4  Group Time: 1-2:30 pm  Participation Level: Active  Behavioral Response: Appropriate and Sharing  Type of Therapy: Process Group  Interventions: Supportive  Topic: Process: A former member appeared today and said his 'good-byes' to his fellow group members. This counselor has asked him to come and demonstrate what a healthy farewell looks like. The patient shared about his new job and his continued commitment to sobriety. He urged group members to make the most of their time here in the program. After he left, the members engaged in a 'process' session. They shared about current issues and challenges in early recovery. One member disclosed the differences with her husband who is currently at [place]. she is here with her parents and their 455-month-old son. While both are in recovery, he seems to be in a considerably different place, in terms of "recovery mentality". The group provided helpful feedback and the importance of her sharing and 'venting' was encouraged and validated. During this half of group, members shared about their plans for the upcoming Christmas holiday.   Group Time: 2:30-4 pm  Participation Level: Active  Behavioral Response: Appropriate and Sharing  Type of Therapy: Psycho-education Group  Interventions: Solution Focused  Topic: Psycho-Ed: "Most Common Reasons for Relapse". The second half of group was spent in a psycho-ed on the four most common reasons for relapse. A handout was provided, and the group read the handout together and discussed these four common reasons. Members identified the reasons they might be most tempted to use and/or relapse. The session emphasized identifying strategies/plans to address these reasons when they appear. Three group members were absent today.  Summary: The  patient reported she had attended the [address] Speaker meeting last night. She had gone with her sponsor and some other ladies [place] [facility] [place] on . it had been a good meeting. She reported she had taken her 16-mo. son [place] [employer] this [place] and he had run around, and it wasn't as relaxing as she had hoped. The patient got teary as she shared about her struggles with her husband. They are both in early recovery from opioid addiction and he is in their home in [place]. While she is feeling safe and stable here in [place] with her parents and son, her husband [place] to [place] [facility] [place]. The patient admitted he is very manipulative and his efforts to get her to travel were really working on her. The patients' fellow group members provided good feedback and encouraged her to focus on what was best for her recovery? In the psycho-ed, the patient reported it was most likely the emotional pain that would lead her back to using opiates. She was able to identify the self-talk she will use should she experience these urges. The patient was engaged and active in group today and the group learned much more about her personal life and struggles that she is dealing with. The patient made some good comments and responded well to this intervention.    UDS collected: No Results:  AA/NA attended?: YesWednesday  Sponsor?: Yes   Charmian MuffAnn Caretha Rumbaugh, LCAS 01/25/2016 10:22 AM

## 2016-01-26 ENCOUNTER — Encounter (HOSPITAL_COMMUNITY): Payer: Self-pay | Admitting: Medical

## 2016-01-26 ENCOUNTER — Other Ambulatory Visit (HOSPITAL_COMMUNITY): Payer: BLUE CROSS/BLUE SHIELD | Admitting: Psychology

## 2016-01-26 ENCOUNTER — Encounter (HOSPITAL_COMMUNITY): Payer: Self-pay | Admitting: Psychology

## 2016-01-26 DIAGNOSIS — F1120 Opioid dependence, uncomplicated: Secondary | ICD-10-CM

## 2016-01-26 NOTE — Progress Notes (Signed)
    Daily Group Progress Note  Program: CD-IOP   01/26/2016 Catherine Joyce [id number]  Diagnosis:  Opioid use disorder, severe, dependence (HCC)  Amphetamine use disorder, severe, dependence (HCC)  Atypical depression  GAD (generalized anxiety disorder)  PTSD (post-traumatic stress disorder)  Chronic pain syndrome  Cigarette smoker  Alcoholism and drug addiction in family  History of attention deficit hyperactivity disorder (ADHD)   Sobriety Date: 12/11/15  Group Time: 1-2:30pm  Participation Level: Active  Behavioral Response: Appropriate and Sharing  Type of Therapy: Process Group  Interventions: CBT  Topic: Patients discussed their recovery from mind-altering drugs and alcohol and the challenges, successes, and events related to tx. One new pt was present. Two pts met with Darlyne Russian, PA for a discharge and entry into the program (see note). Patients shared about their holiday break and stressors from family gatherings. Some members had new sobriety dates. One member admitted to lying to the group in previous sessions and was confronted by group members about his motivation and future status in the group.     Group Time: 2:30-4  Participation Level: Active  Behavioral Response: Appropriate and Sharing  Type of Therapy: Psycho-education Group  Interventions: CBT  Topic: Counselor led discussion on honesty and seeking sobriety during difficult times such as holidays. Patients were active and engaged in discussion. Counselor led a graduation ceremony for one member who successfully discharged today.    Summary: Patient was active and engaged in group discussion. She met with Darlyne Russian, program director who discussed medications. She stated she attended six 12-step meetings over the break and communicated with her sponsor multiple times. Pt reported on being caught drug-seeking but admitted she quickly recognized it as a triggering situation and promptly  reported to her husband her intention. A subsequent argument ensued in which husband was not supportive of her sobriety and expressed his own needs should come before her sobriety. Patient admitted she was not happy with her husband right now but was focusing on her sobriety and this program. Patient expressed strong insight into her addictive nature and good perspective on her immediate needs in early recovery.   UDS collected: Yes Results:  pending  AA/NA attended?: YesMonday, Tuesday, Thursday, Friday, Saturday and Sunday  Sponsor?: Yes   Brandon Melnick, LCAS 01/26/2016 4:42 PM

## 2016-01-27 ENCOUNTER — Encounter (HOSPITAL_COMMUNITY): Payer: Self-pay | Admitting: Psychology

## 2016-01-27 NOTE — Progress Notes (Signed)
    Daily Group Progress Note  Program: CD-IOP   01/27/2016 Donna Bernard [id number]  Diagnosis:  No diagnosis found.   Sobriety Date: 11/12  Group Time: 1-2:30 pm  Participation Level: Active  Behavioral Response: Appropriate and Sharing  Type of Therapy: Process Group  Interventions: Supportive  Topic: Process: the first half of group was spent in process. Members shared what they had done to support their sobriety since we met yesterday as well as any challenges or temptations they had faced. A new group member appeared in this session and he introduced himself to his new fellow group members. He received a warm welcome. A drug test was collected from him.   Group Time: 2:30-4 pm  Participation Level: Active  Behavioral Response: Appropriate and Sharing  Type of Therapy: Psycho-education Group  Interventions: Strength-based  Topic: Psycho-Ed: the second half of group was spent in a psycho-ed. Handouts were provided that included 'Relationship Maps'. The focus was on identifying the people that group members are in relationship with and determining, by 'mapping them', how significant they are in their daily lives. Members were asked to share what they had drawn and explain how they night hope to change them in the future. As the session ended, members shared about their plans over the New Year's weekend. There are some parties at [employer] and there was a lively discussion about attending them. The group will meet next on Wednesday, February 01, 2016.  Summary: The patient reported she had gone home after group yesterday and spent time with her 29-monthold son. They are living with her parents currently and her parents have been very supportive of her recovery and enjoy looking after their only grandchild. Later, after putting him to bed, she went with other women in recovery to a coffee shop and then to the [place]eting, "[employer]". The patient reported she is having  problems with her sleep medication. She feels groggy and kind of hungover in the mornings and the program director eliminated one of her sleep meds yesterday when they met. She is hoping that this side effect goes away with one less medication. In the psycho-ed, the patient shared about her 'person to person' map. She noted that her husband seems distant on her map and it made her sad. The patient became teary as she described and admitted she never intends to return to [place] where they have lived and where he remains. The patient received supportive feedback from her fellow group members. As the session came to an end, the patient reported her home group's plans for the New Year. She invited group members to come to the church where there will be food, two speakers along with music and dancing. She is assisting in the preparations as part of her service work. The patient responded well to this intervention. She was candid about her marriage and the work both she and her husband must do in each of their recovery's if they are going to be together. We will continue to follow closely in the days and weeks ahead.    UDS collected: No Results:   AA/NA attended?: YesWednesday  Sponsor?: Yes   ABrandon Melnick LCAS 01/27/2016 10:55 AM

## 2016-02-01 ENCOUNTER — Other Ambulatory Visit (HOSPITAL_COMMUNITY): Payer: BLUE CROSS/BLUE SHIELD | Attending: Medical | Admitting: Psychology

## 2016-02-01 DIAGNOSIS — F1120 Opioid dependence, uncomplicated: Secondary | ICD-10-CM

## 2016-02-02 ENCOUNTER — Other Ambulatory Visit (HOSPITAL_COMMUNITY): Payer: BLUE CROSS/BLUE SHIELD | Admitting: Psychology

## 2016-02-02 ENCOUNTER — Other Ambulatory Visit (HOSPITAL_COMMUNITY): Payer: Self-pay | Admitting: Medical

## 2016-02-02 DIAGNOSIS — F1120 Opioid dependence, uncomplicated: Secondary | ICD-10-CM

## 2016-02-02 DIAGNOSIS — F4310 Post-traumatic stress disorder, unspecified: Secondary | ICD-10-CM

## 2016-02-02 DIAGNOSIS — F411 Generalized anxiety disorder: Secondary | ICD-10-CM

## 2016-02-02 MED ORDER — BUPROPION HCL ER (XL) 300 MG PO TB24: 300.0000 mg | ORAL_TABLET | Freq: Every day | ORAL | 2 refills | Status: DC

## 2016-02-02 MED ORDER — LAMOTRIGINE 25 MG PO TABS: 25.0000 mg | ORAL_TABLET | Freq: Every day | ORAL | 2 refills | Status: DC

## 2016-02-03 ENCOUNTER — Encounter (HOSPITAL_COMMUNITY): Payer: Self-pay | Admitting: Psychology

## 2016-02-03 NOTE — Progress Notes (Signed)
Catherine NgoJane Joyce is a 31 y.o. female patient. CD-IOP: Individual Counseling Session. Patient appeared this morning as scheduled for our weekly individual session. We will meet every Friday at 9 am to review goals and discuss specific strategies in order to remain drug-free. The patient is a good group member and shares openly about her own struggles and provides helpful feedback to her fellow group members. The patient reported she had a good evening yesterday after group. She had gone to a coffee shop with other recovering women and then to a meeting. Today, her parents dropped her off here for her session and took their 7554-month-old grandson to the [employer]. They will pick her up when she is finished with our meeting. In reviewing her treatment goals, the patient remains drug-free with a sobriety date of 11/12. She has a sponsor and is very invested in [facility]. Her third treatment goal addresses her chronic depression and anxiety. She admitted that she feels more safe and secure since she is living at her parents' home and this is more comforting and less stressful for her. When asked about her mediations, the patient reported she feels good on her current meds and noted she would pick up the refills of Lamictal and Wellbutrin at [facility] later today. I had spoken with the program director and he had agreed to send in refills yesterday. We agreed that next week we would do some Heart Math on my computer to help her gain more skills ion learning to calm herself down when she is feeling anxious. We also discussed her marriage and plans relating to her husband. He is in [place] and is a recovering addict. He is currently in a Suboxone program, but does not seem to be doing any 'work' to address his inner self and the emotional and spiritual changes that must occur in recovery. All he is doing it taking the Suboxone to prevent withdrawal. Although he was in residential treatment in 2014, he has  admitted that he only went to satisfy his family and has never followed up with anything afterwards.  He is not currently attending any 12-step meetings and actually teases his wife about attending AA meetings. The patient agreed that he is going to need to work on himself and to grow as she is growing or they will have a difficult time staying together.  She also explained that he is currently working on a project in [place], but it will conclude in February and he could move [place] to [place] where they have an office, at the end of this project. She has previously stated that moving back to [place] is not really an option that she intends to explore. We discussed options that might allow her to get her car and drive it back to [place]. [place]. The question remains whether she can safely go down to [place] and be with him for a day or two or if that is too risky to her recovery. I encouraged her to discuss this with her parents as well as her support network in [place]. The patient plans a busy weekend with meetings and spending time with her son. She also speaks with her sponsor daily. The patient is making measurable progress in her recovery and has proven to be a very positive group member. She responded well to this intervention.         Charmian MuffAnn Isaah Furry, LCAS

## 2016-02-03 NOTE — Progress Notes (Signed)
Catherine Joyce is a 31 y.o. female patient. CD-IOP: Treatment Planning Session. Met with the patient this morning before her group session. She has completed three group sessions in the program and has proven to be a good group member with a willing to disclose about her own issues and provide helpful feedback to her fellow group members. The importance of identifying goals for her treatment was reviewed and she was very agreeable to identifying her goals for treatment. They include sobriety and building a supportive network. I applauded her active engagement in the [facility] and having secured a sponsor. The patient admitted she is developing real female friends for the first time and finding it fulfilling. When asked about other goals for treatment, the patient reported she would like to address her depression and anxiety. She stated she would like to become more satisfied and content in her life. She has struggled with depression for most of her life and noted that the current medications she is taking seem to be doing well for her. The patient has only been on this current regiment for about six weeks so their full effectiveness has yet to be fully experienced. Shed identified the ways she can achieve this satisfying and contented life by staying drug-free, working her program, and taking her medications. She also agreed that being active (she goes to the gym with a friend), practicing better self-care around diet and working on meditation will all help advance her towards this goal. The treatment plan was reviewed, signed and completed accordingly. We will continue to follow closely ion the days ahead. The patient responded well to this intervention.         Brandon Melnick, LCAS

## 2016-02-05 ENCOUNTER — Encounter (HOSPITAL_COMMUNITY): Payer: Self-pay | Admitting: Psychology

## 2016-02-05 NOTE — Progress Notes (Signed)
    Daily Group Progress Note  Program: CD-IOP   02/05/2016 Donna Bernard [id number]  Diagnosis:  No diagnosis found.   Sobriety Date: 11/12  Group Time: 1-3:30 pm  Participation Level: Active  Behavioral Response: Appropriate and Sharing  Type of Therapy: Process Group  Interventions: Supportive  Topic: Process: The first half of group was spent in process. Members shared about the long holiday weekend and the challenges and temptations they presented themselves. They also identified what they had done over the weekend to support and strengthen their recovery. Drug tests were collected from everyone and the program director met one member for discharge today and another member for her initial visit. A new group member was present.   Group Time: 3:30-4 pm  Participation Level: Active  Behavioral Response: Sharing  Type of Therapy: Process Group  Interventions: Supportive  Topic: Process/Graduation: The second half of group was much shorter today due to the lengthy time spent in the first half. There was much to be discussed since we had last met 6 days ago. The new group member introduced herself. The counselor assisted her in sharing why she was here. This new member received a warm welcome from the group and invitation to join one of the women tomorrow morning for an AA meeting. A graduation ceremony was held to honor a member who was successfully completing the program today. Brownies, the graduation medallion and words of admiration and hope were shared with her as she leaves the program.   Summary: The patient reported she had enjoyed the weekend and had fun helping with the [place] party. She reminded the group that her sponsor wants her to do service work and this is one way she can help others and give of herself. the patient reported she had gotten into an argument with her husband. They had snow in [place] and is not working right now. she stated he had  drunk Phenergan and gone to sleep. Another member questioned this and pointed out that this sounds like active addiction. This patient agreed. The patient reported she had spent time with her sponsor and other women in recovery and attended 4 [place] meetings. She had also gone to the gym once with a friend. The patient complained about what seemed like a sudden weight gain and puffiness that she could not explain. The patient reported she felt very 'swollen' and the scale on which she had weighed, indicated she had gained 9 lbs. She questioned whether her meds had caused this? But then she weighed the next day and she had lost those lbs. The patient provided excellent feedback to her fellow group member and explained that for her, journaling has proven very beneficial. She is also working on staying 'present' and not getting in to her head too much. The patient continues to make good progress and responded well to this intervention. She shared kind words with the graduating member and encouraged her to remain determined and to take care of herself first and foremost.    UDS collected: Yes Results: negative  AA/NA attended?: OINOMVEHM, Thursday, Friday, Saturday and Sunday  Sponsor?: Yes   Brandon Melnick, LCAS 02/05/2016 2:37 PM

## 2016-02-06 ENCOUNTER — Other Ambulatory Visit (HOSPITAL_COMMUNITY): Payer: BLUE CROSS/BLUE SHIELD | Admitting: Psychology

## 2016-02-06 DIAGNOSIS — F411 Generalized anxiety disorder: Secondary | ICD-10-CM

## 2016-02-06 DIAGNOSIS — F4310 Post-traumatic stress disorder, unspecified: Secondary | ICD-10-CM

## 2016-02-06 DIAGNOSIS — F1120 Opioid dependence, uncomplicated: Secondary | ICD-10-CM

## 2016-02-07 NOTE — Progress Notes (Signed)
    Daily Group Progress Note  Program: CD-IOP   02/07/2016 Donna Bernard [id number]  Diagnosis:  Opioid use disorder, severe, dependence ([place])  PTSD (post-traumatic stress disorder)  GAD (generalized anxiety disorder)   Sobriety Date: 11/12  Group Time: 1-2:30  Participation Level: Active  Behavioral Response: Appropriate and Sharing  Type of Therapy: Process Group  Interventions: CBT  Topic: Counselors met with patients for 1.5 hour group process in which pts checked in about their sobriety date, recovery related behaviors and actions, meeting attendance, medications, activities of daily living, and challenges to their sobriety. One member completed successfully and participated in a completion ceremony.     Group Time: 2:30-4  Participation Level: Active  Behavioral Response: Appropriate and Sharing  Type of Therapy: Psycho-education Group  Interventions: Other: powerlessness and 12 steps  Topic: Counselors met with patients for 1.5 hour group psychoeducation session in which pts discussed the 12 Step idea of "powerlessness" and the difference between it and weakness. Counselors discussed cultural implications and empowered pts to see "victory through surrender". Patients were agreeable and discussed the topic at length as it related to their own personal lives.    Summary: Patient was active, engaged, and spoke openly in session. She provided helpful feedback to other group members concerning their rights and boundary setting. Patient reported on her own relationship with her husband and her feeling unsupported. She feels hopeful about her own recovery but not her relationship with her husband. She reported attending 1 AA meeting since last group and called her sponsor to discuss a difficult fight she had with her husband on the phone. Patient stated she is sleeping better but having "intense dreams" which she thinks may be caused by medications. She reports  strengthening her relationship with her mom and feeling supported by her parents with whom she's currently living. Patient appears to be fully compliant in tx and "surrendered" to the 12 step process. She stated that she does feel powerless over chemicals but does not feel powerless over "her addiction". Other members agreed this was a powerful statement and thanked her for sharing. Youlanda Roys, LPCA   UDS collected: No Results: negative  AA/NA attended?: YesWednesday  Sponsor?: Yes   Brandon Melnick, LCAS 02/07/2016 11:18 AM

## 2016-02-08 ENCOUNTER — Other Ambulatory Visit (HOSPITAL_COMMUNITY): Payer: BLUE CROSS/BLUE SHIELD | Admitting: Psychology

## 2016-02-08 ENCOUNTER — Encounter (HOSPITAL_COMMUNITY): Payer: Self-pay

## 2016-02-08 ENCOUNTER — Other Ambulatory Visit (HOSPITAL_COMMUNITY): Payer: Self-pay | Admitting: Medical

## 2016-02-08 VITALS — BP 112/78 | HR 92 | Ht 64.5 in | Wt 144.0 lb

## 2016-02-08 DIAGNOSIS — F4310 Post-traumatic stress disorder, unspecified: Secondary | ICD-10-CM

## 2016-02-08 DIAGNOSIS — R5382 Chronic fatigue, unspecified: Secondary | ICD-10-CM

## 2016-02-08 DIAGNOSIS — F411 Generalized anxiety disorder: Secondary | ICD-10-CM

## 2016-02-08 DIAGNOSIS — F1120 Opioid dependence, uncomplicated: Secondary | ICD-10-CM

## 2016-02-08 MED ORDER — LAMOTRIGINE 100 MG PO TABS: 100.0000 mg | ORAL_TABLET | Freq: Every day | ORAL | 2 refills | Status: DC

## 2016-02-09 ENCOUNTER — Other Ambulatory Visit (HOSPITAL_COMMUNITY): Payer: BLUE CROSS/BLUE SHIELD | Admitting: Psychology

## 2016-02-09 DIAGNOSIS — F411 Generalized anxiety disorder: Secondary | ICD-10-CM

## 2016-02-09 DIAGNOSIS — F1120 Opioid dependence, uncomplicated: Secondary | ICD-10-CM

## 2016-02-09 DIAGNOSIS — F1520 Other stimulant dependence, uncomplicated: Secondary | ICD-10-CM

## 2016-02-09 DIAGNOSIS — F4310 Post-traumatic stress disorder, unspecified: Secondary | ICD-10-CM

## 2016-02-10 ENCOUNTER — Encounter (HOSPITAL_COMMUNITY): Payer: Self-pay | Admitting: Psychology

## 2016-02-10 ENCOUNTER — Encounter (HOSPITAL_COMMUNITY): Payer: Self-pay

## 2016-02-10 NOTE — Progress Notes (Signed)
Catherine Joyce is a 31 y.o. female patient. CD-IOP: Individual Counseling session. Met with the patient as scheduled this morning. She reported her son was feeling better this morning. He had been sick yesterday and had a temperature. Today she noted that his fever was gone. Her parents are watching him and will contact her if anything changes. She had picked up her 60-day chip last night. It had been a fun evening with a group of women eating dinner at "[employer]" before the meeting. The patient reported she is attending at least one [place] meeting per day, applying herself to step work and addressing the challenges of this program. The patient admitted, "sometimes I am bored, but other times I feel as if I have too much to do". We discussed her eventual move back into employment and she agreed that she would like something part-time, at least to begin with. The patient reiterated her desire to work in the addiction field and, possibly, one day work at [employer]. She could easily take classes for her LCAS. She also noted that she would have to be licensed in [place] because they do not accept her license from [facility]. I assured her that there are many opportunities in the field and she could find work readily. We discussed her plans going forward around her husband. I wondered if he was on board with moving up to [place]. The patient had said that her husband's company has a [place] location and he would be willing to move. She has repeatedly stated in group session that she cannot go back to [place]. they would need to begin notifying their property owner about moving and since his project is finishing in February and she is completing the program in February they should begin making plans now. The patient grew more excited about this prospect since her husband seems to be growing more fearful that he is losing his wife and son. Perhaps discussing plans to move and settle here in the area and rent a house will  provide some comfort and alleviate his fears. The patient agreed with this plan and stated she would like to have a conference call with her counselor and her husband. We decided on Tuesday, the 16th after 11 am. He usually has lunch around that time and can be on the phone and talk out of his office. She agreed to contact him later today and schedule that time if it works for him. Relative to her treatment goals, the patient is making measurable progress. She picked up 60-day chip yesterday, has a sponsor, is completing her step work, and has a Advertising account executive of support for her recovery. The patient is feeling a bit more relaxed and comfortable and noted her depression is relatively stable with no dips lately. The uncertainty about her husband and his sobriety is her single biggest source of anxiety, but she also knows she cannot change him. She seems firmly devoted to working on herself. The patient has attended nine group sessions to date and is an active and engaged group member. Her sobriety date remains 11/12. She responded well to this intervention.        Brandon Melnick, LCAS

## 2016-02-10 NOTE — Progress Notes (Signed)
    Daily Group Progress Note  Program: CD-IOP   02/10/2016 Catherine Joyce [id number]  Diagnosis:  Opioid use disorder, severe, dependence ([place])  PTSD (post-traumatic stress disorder)  GAD (generalized anxiety disorder)   Sobriety Date: 12/11/15  Group Time: 1-2:30pm  Participation Level: Active  Behavioral Response: Appropriate and Sharing  Type of Therapy: Process Group  Interventions: CBT and Strength-based  Topic: Counselors met with patients for 1.5 hour group process session. Topics included recovery from mind altering drugs and alcohol, sobriety, medication management, acl's, and 12 step facilitation. One new patient was present and appeared attentive and met with Investment banker, operational. One pt was missing due to unexcused absence.      Group Time: 2:30-4pm  Participation Level: Active  Behavioral Response: Appropriate and Sharing  Type of Therapy: Psycho-education Group  Interventions: Other: gestalt therapy and chaplain  Topic: Counselors and guest chaplain met with patients for 1.5 hour group psychoeducation session. Topics included Gestalt theory, immediate processing, and recovery from mind altering drugs and alcohol. Chaplain led 10 minute "emotion check-in" and had each group member participate in a reading/ experiential activity. Patients were very attentive and engaged for session.    Summary: Patient was euthymic, active, and engaged in group. She reported attending 2 AA meetings and enjoying a new leadership role in her homegroup. 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 She is still experiencing distress with her husband who is dealing with his own addiction and is wanting to take away financial support from pt. Pt stated it upset her but she is more focused on her recovery than her relationship with her husband right now. Pt stated she does a morning meditation, walk, and is feeling very hopeful about herself. She expressed  that she now allows herself to feel intense feelings but then "watches them pass w/o too much distress". Patient shared concerns about her lack of sexual interest in her husband and feeling confused. Catherine Joyce, LPCA   UDS collected: Yes Results: negative  AA/NA attended?: YesWednesday and Thursday  Sponsor?: Yes   Catherine Joyce, LCAS 02/10/2016 10:39 AM

## 2016-02-11 ENCOUNTER — Encounter (HOSPITAL_COMMUNITY): Payer: Self-pay | Admitting: Psychology

## 2016-02-11 NOTE — Progress Notes (Signed)
    Daily Group Progress Note  Program: CD-IOP   02/11/2016 Anshu Powell [id number]  Diagnosis:  No diagnosis found.   Sobriety Date: 11/12  Group Time: 1-2:30pm  Participation Level: Active  Behavioral Response: Appropriate and Sharing  Type of Therapy: Process Group  Interventions: Supportive  Topic: Process: Counselors met with patients to discuss the activities they had engaged in to support their recovery. A new group member introduced himself to the group and shared his reasons for pursuing treatment. All members were engaged in the discussion concerning recovery from mind-altering drugs and alcohol.  Group Time: 2:30-4pm  Participation Level: Active  Behavioral Response: Sharing  Type of Therapy: Psycho-education Group  Interventions: Strength-based  Topic: Psychoeducation: Counselors led a discussion regarding the serenity prayer and how the idea of acceptance applies to recovery. Patients listed five things they could and could not change and shared them with the group. A patient successfully graduated from the group. Drug tests were collected from several group members.   Summary: The patient presented as active and engaged in group. She shared that her son had woken up with a fever and had been unsure if she would be able to attend group. She planned to pick up her 60 day chip at the meeting that evening. In speaking with the program director she shared concerns about her medication shifts and fatigue she had been experiencing. During the discussion on the serenity prayer she shared that she has accepted that she has no control over her family's beliefs/her relationship with her family. The patient attended one AA meeting last night. She responded well to this intervention.   UDS collected: No Results:   AA/NA attended?: YesWednesday  Sponsor?: Yes    , LCAS 02/11/2016 2:18 PM 

## 2016-02-13 ENCOUNTER — Other Ambulatory Visit (HOSPITAL_COMMUNITY): Payer: BLUE CROSS/BLUE SHIELD | Admitting: Psychology

## 2016-02-13 DIAGNOSIS — F1120 Opioid dependence, uncomplicated: Secondary | ICD-10-CM

## 2016-02-13 NOTE — Progress Notes (Signed)
    Daily Group Progress Note  Program: CD-IOP   02/13/2016 Catherine Joyce [id number]  Diagnosis:  Opioid use disorder, severe, dependence ([place])  PTSD (post-traumatic stress disorder)  GAD (generalized anxiety disorder)   Sobriety Date: 12/11/15  Group Time: 1-2:30pm  Participation Level: Active  Behavioral Response: Appropriate  Type of Therapy: Process Group  Interventions: Supportive  Topic: Process: the first half of group was spent in process. Members shared about the things they had done over the weekend to support their sobriety. Any challenges or temptations were also discussed. A new member was present, and she introduced herself during the session.   Group Time: 2:30-4pm  Participation Level: Active  Behavioral Response: Appropriate  Type of Therapy: Psycho-education Group  Interventions: Strength-based  Topic: Psycho-Ed: the second half of group was spent in a psycho-ed. A handout was provided that asked members to identify three circles. The inner circle was what would include using behaviors, the middle circle identified things that could lead to using and the largest, outer circle was to be filled with things that would promote sobriety. Members took some time to identify people, places, things and emotions, in each of the three circles and then discussed them. During this half of group, the program director met with one of the newer group members.   Summary: The patient provided good feedback to her fellow group member who had disclosed his recent use. She admitted it was really hard to fight the cravings and that she had had to struggle with cravings in early recovery as well. Fortunately, she was in a residential treatment facility and couldn't get anything. The patient noted that naltrexone had also helped her. She reported having attended four AA meetings over the weekend. She expressed concerns and disappointment in a conversation with her brother-in-law  about her husband and his brother. The fellow told her that he thought that her husband might prefer her to be home with him in [place] and be sick rather than be separated with her sober and healthy. Her conversations with her husband over the weekend had not gone well and he seems angry, but she recognizes him as being scared. "he might feel as if he is losing [place]". She noted that their 24-monthold son is doing well and brings her joy. In the psycho-ed, the patient, the patient her middle circle as including 'physical pain, [place] all day feeling hopeless. Her outer circle includes attending ASelmameetings and talking with her sponsor. The patient provided helpful feedback and responded well to this intervention.    UDS collected: No Results:   AA/NA attended?: YesThursday, Friday, Saturday and Sunday  Sponsor?: Yes   ABrandon Melnick LCAS 02/13/2016 4:55 PM

## 2016-02-15 ENCOUNTER — Other Ambulatory Visit (HOSPITAL_COMMUNITY): Payer: BLUE CROSS/BLUE SHIELD

## 2016-02-16 ENCOUNTER — Encounter (HOSPITAL_COMMUNITY): Payer: Self-pay | Admitting: Psychology

## 2016-02-16 ENCOUNTER — Other Ambulatory Visit (HOSPITAL_COMMUNITY): Payer: BLUE CROSS/BLUE SHIELD

## 2016-02-16 NOTE — Progress Notes (Signed)
Daily Group Progress Note  Program: CD-IOP   02/16/2016 Catherine Joyce [id number]  Diagnosis:  No diagnosis found.   Sobriety Date: 11/12  Group Time: 1-3pm  Participation Level: Active  Behavioral Response: Appropriate and Sharing  Type of Therapy: Process Group  Interventions: Supportive  Topic: Process: The first half of group was spent in process. Members shared about any struggles, temptations or obstacles in early recovery. Several group members were absent. Drug tests were collected from from four of the members present today.   Group Time: 3-4pm  Participation Level: Active  Behavioral Response: Sharing  Type of Therapy: Psycho-education Group  Interventions: Strength-based  Topic: Psycho-Ed: Values; What do you most value? The second half of group was considerably shorter than is typical due to the lengthy time spent in process. Members identified what they held most important and a discussion ensued.   Summary: The patient reported she attended five AA meetings since our last group session. She picked up her 60 day chip and the group applauded this news. She explained that her sponsor wants her to pick up a chip at every meeting she attends this week. It's not for herself, but for newcomers so they see people are being successful and staying sober. The patient expressed frustration that she is sleeping too much. Another member encouraged her to try melatonin - it really works for her, but this patient reported she had tried it and it gave her vivid dreams that were unpleasant. She reported that she has always struggled with being tired and she will get her blood taken and have some tests run, 'but I don't think I am anemic', she reported. She reported she is going to meet with her counselor tomorrow and they are going to have a conference call with her husband in [place], [place]. He is still down there working, but the patient insists she is not going back there and  the husband knows this as well. His company has a location here in [place] and her hope is that he can transfer up to this area and they will live here. He is angry and frustrated that she won't bring their 1 month-old son down to visit, but the patient has repeatedly stated that he screams the entire time in a car seat and its a long way. Plus she is uncomfortable with the triggers that the town represents for her. The patient admitted that she and her mother got into an argument this morning after her mother mentioned that perhaps she should be spending more time with her son. The patient responded defensively and felt very criticized. In group she admitted she feels as if she is doing everything she can for herself and her child by working a program of recovery. She does go to a lot of meetings and does spend 10 hours per week here and with her sponsor. She had cried, but was better now. The patient admitted she sometimes feels guilty about not having been with her son more. This patient provided helpful feedback to a fellow member who cried because she felt she was doing everything she could, but had only attended one 12-step meeting since last Thursday. This patient had explained that once she got more involved and attended more meetings she met more women in recovery and transportation has not been a problem since then. She offered to pick up her fellow group member on an agreed-upon day and go with her to a meeting. In the psycho-ed, the patient identified  her sobriety and her relationship with her Higher Power as the most important things she values. These are followed closely by her son, Catherine Joyce, but she was quick to point out that without her sobriety, she will lose her son. The patient shared openly about her struggles in group today and provided helpful, but firm feedback emphasizing how important it is to go to meetings for one's recovery. She responded well to this intervention.    UDS collected:  Yes Results: negative  AA/NA attended?: YesThursday, Friday, Saturday and Sunday  Sponsor?: Yes   Brandon Melnick, LCAS 02/16/2016 2:01 PM

## 2016-02-20 ENCOUNTER — Other Ambulatory Visit (HOSPITAL_COMMUNITY): Payer: BLUE CROSS/BLUE SHIELD | Admitting: Psychology

## 2016-02-20 DIAGNOSIS — F1120 Opioid dependence, uncomplicated: Secondary | ICD-10-CM

## 2016-02-20 DIAGNOSIS — F1520 Other stimulant dependence, uncomplicated: Secondary | ICD-10-CM

## 2016-02-22 ENCOUNTER — Other Ambulatory Visit (HOSPITAL_COMMUNITY): Payer: BLUE CROSS/BLUE SHIELD | Admitting: Psychology

## 2016-02-22 ENCOUNTER — Encounter (HOSPITAL_COMMUNITY): Payer: Self-pay | Admitting: Medical

## 2016-02-22 DIAGNOSIS — G4700 Insomnia, unspecified: Secondary | ICD-10-CM

## 2016-02-22 DIAGNOSIS — Z79899 Other long term (current) drug therapy: Secondary | ICD-10-CM

## 2016-02-22 DIAGNOSIS — F5105 Insomnia due to other mental disorder: Secondary | ICD-10-CM

## 2016-02-22 DIAGNOSIS — F1120 Opioid dependence, uncomplicated: Secondary | ICD-10-CM

## 2016-02-22 MED ORDER — PROPRANOLOL HCL 10 MG PO TABS: ORAL_TABLET | ORAL | 2 refills | Status: DC

## 2016-02-22 NOTE — Progress Notes (Signed)
Pt seen at her request for FU on her medications and labs (Thyroid) and Genesight [facility] she doesnt think these will be covered ? She has stopped her Trazodone and feels better in am and about dreams. She only sleeps for 4 hours with Seroquel 25 mg before awakening. She will try 50 mg of Seroquel HS and FU in a week

## 2016-02-23 ENCOUNTER — Encounter (HOSPITAL_COMMUNITY): Payer: Self-pay | Admitting: Psychology

## 2016-02-23 ENCOUNTER — Other Ambulatory Visit (HOSPITAL_COMMUNITY): Payer: BLUE CROSS/BLUE SHIELD | Admitting: Psychology

## 2016-02-23 DIAGNOSIS — F1520 Other stimulant dependence, uncomplicated: Secondary | ICD-10-CM

## 2016-02-23 DIAGNOSIS — F4310 Post-traumatic stress disorder, unspecified: Secondary | ICD-10-CM

## 2016-02-23 DIAGNOSIS — F1120 Opioid dependence, uncomplicated: Secondary | ICD-10-CM

## 2016-02-23 NOTE — Progress Notes (Signed)
    Daily Group Progress Note  Program: CD-IOP   02/23/2016 Catherine Joyce [id number]  Diagnosis:  No diagnosis found.   Sobriety Date: 11/12  Group Time: 1-3:30pm  Participation Level: Active  Behavioral Response: Appropriate and Sharing  Type of Therapy: Process Group  Interventions: Supportive  Topic: Process: The first half of group was spent in process. Group members shared about the past week and the two snow days that kept this office closed. The group had not met since last Monday. There was a lot to report on and, clearly, the heavy snow poor roads upset the routines and plans for many. A new group member was present today along with another group member who had been out for two weeks in a relapse. Drug tests were collected from everyone present and the medical director met with at least 3 group members today.   Group Time: 3:30-4pm  Participation Level: Active  Behavioral Response: Sharing  Type of Therapy: Activity Group  Interventions: Strength-based  Topic: Psycho-Ed: The psycho-ed was very brief due to the lengthy time spent in process. A five-minute relaxation meditation was played, and the group processed about their experience of the guided meditation. Members reported it had proven relaxing and they enjoyed the activity.   Summary: The patient reported it was difficult and challenging to have her schedule and routine upset due to the snow. She expressed frustration and disappointment with her husband after their conference call last week. He had hung up on her and then sent flowers and told her everything she wanted to hear. She questioned his sincerity. The patient admitted she wasn't sure she even wanted to be with her husband and was considering separation. She admitted she was having a lot of desires and was hesitant, based on AA recommendations, to act on her desires. The patient received a lot of feedback from her fellow group members and thanked them for  their support. The patient responded well to the meditation and reported she practices a type of meditation every day. She made some helpful comments and responded well to this intervention.   UDS collected: Yes Results: negative  AA/NA attended?: YesFriday, Saturday and Sunday  Sponsor?: Yes   Brandon Melnick, LCAS 02/23/2016 5:40 PM

## 2016-02-24 ENCOUNTER — Encounter (HOSPITAL_COMMUNITY): Payer: Self-pay | Admitting: Psychology

## 2016-02-26 ENCOUNTER — Encounter (HOSPITAL_COMMUNITY): Payer: Self-pay | Admitting: Psychology

## 2016-02-26 NOTE — Progress Notes (Signed)
    Daily Group Progress Note  Program: CD-IOP   02/26/2016 Catherine Joyce [id number]  Diagnosis:  No diagnosis found.   Sobriety Date: 11/12  Group Time: 1-2:30pm  Participation Level: Active  Behavioral Response: Appropriate and Sharing  Type of Therapy: Process Group  Interventions: Supportive  Topic: Process: Counselors met with patients to discuss the activities they had engaged in to support their recovery. A new group member introduced herself to the group and shared her reasons for pursuing treatment. All members were engaged in the discussion concerning recovery from mind-altering drugs and alcohol.  Group Time: 2:30-4pm  Participation Level: Active  Behavioral Response: Appropriate  Type of Therapy: Psycho-education Group  Interventions: Solution Focused  Topic: Psychoeducation: Group leaders provided information around "affective" processes, and facilitated a discussion regarding each group member's subjective experiences of emotions; this was achieved through a process game, "Emotional Jenga." Particular emphasis was placed on how certain emotional experiences can be used as insight into client's needs, concerns, or growth related to their recovery. Drug tests were collected from several members.   Summary: The patient presented as active and very engaged throughout group. She shared in detail about completing her Third Step with her sponsor, and also nervousness around beginning Step 4. She often presents with behaviors consistent with perfectionism, such as high concern around completing the steps correctly. However, patient demonstrates a strong ability to integrate recovery into her daily routines, and specifically seems to be working on setting boundaries and "letting go" of self-will. During the psychoeducation activity, patient shared anxiety around becoming "obsessed with recovery," as she related to becoming obsessive about hobbies or behaviors in the past.  She described elements of fear around this, though was receptive to support from group members and group leaders. Patient reported attending two NA meetings. She responded well to this intervention.   UDS collected: No Results:   AA/NA attended?: YesWednesday  Sponsor?: Yes   Brandon Melnick, [place] 02/26/2016 10:08 AM

## 2016-02-27 ENCOUNTER — Other Ambulatory Visit (HOSPITAL_COMMUNITY): Payer: BLUE CROSS/BLUE SHIELD | Admitting: Psychology

## 2016-02-27 DIAGNOSIS — F1520 Other stimulant dependence, uncomplicated: Secondary | ICD-10-CM

## 2016-02-27 DIAGNOSIS — F4310 Post-traumatic stress disorder, unspecified: Secondary | ICD-10-CM

## 2016-02-27 DIAGNOSIS — F1120 Opioid dependence, uncomplicated: Secondary | ICD-10-CM

## 2016-02-28 ENCOUNTER — Encounter (HOSPITAL_COMMUNITY): Payer: Self-pay | Admitting: Psychology

## 2016-02-28 NOTE — Progress Notes (Signed)
    Daily Group Progress Note  Program: CD-IOP   02/28/2016 Catherine Joyce [id number]  Diagnosis:  No diagnosis found.   Sobriety Date: 11/12  Group Time: 1-2:30pm  Participation Level: Active  Behavioral Response: Appropriate and Sharing  Type of Therapy: Process Group  Interventions: Supportive  Topic: Check-In and Process: the first half of group was spent in a brief check-in and process. The format was changed a little and will continue to be adjusted in the upcoming sessions. Members were asked to check in with sobriety date and how many recovery-based meetings they attended since we last met. After check-in, members were asked to identify three different events or situations that include a 'shining moment" where a strong recovery behavior or perspective was displayed, a "learning moment", which refers to an episode where one gained insight or understanding along with a 'speed-bump" moment. This would refer to an old addictive mentality or attitude being displayed. Members responded quickly to this new format.   Group Time: 2:30-4pm  Participation Level: Active  Behavioral Response: Appropriate and Sharing  Type of Therapy: Psycho-education Group  Interventions: Assertiveness Training  Topic: Psycho-Ed: the second half of group was spent in a psycho-ed on different communication styles. A handout was provided with members reading about passive, aggressive, passive-aggressive and assertive styles of communication. A brief utube video was shown of "Catherine Joyce", a sitcom about a mother and daughter that are both in recovery. Afterwards, group members participated in role-plays that invited assertive communication. Members admitted it was difficult to be assertive, despite being just a role-play. Drug tests were collected from four group members. Two group members were absent.   Summary: The patient was engaged and shared openly in group today. She provided helpful feedback to a fellow  group member regarding the importance of early recovery and getting the treatment one needs. She reported she had been told the same thing by a counselor at rehab who compared this stage of recovery to having 3rd degree burns on your hands. You would be very gentle and be reasonable about what you attempt to do while your hands are healing. The patient reported she attended four AA meetings over the weekend. She described her 'shining moment' as how she is feeling. The patient reported, "I feel good and a degree of serenity and hope - maybe more than at any time in my life". She expained that 'life doesn't feel like a chore' The patient pointed out she had set some firm boundaries with her husband and explained if they make plans to visit each other, they must plan ahead because she is dependent on her parents to get her to [facility]. In the psycho-ed, the patient role played about assertiveness with another group member. She was the obnoxious one who the other had to address in an assertive manner. The patient handled the role play well and had good feedback for her fellow group member. This patient continues to make significant progress in her recovery and her reported improvement in mood was positive. She responded well to this intervention.   UDS collected: No Results:   AA/NA attended?: YesThursday, Friday and Sunday  Sponsor?: Yes   Brandon Melnick, LCAS 02/28/2016 9:42 AM

## 2016-02-29 ENCOUNTER — Other Ambulatory Visit (HOSPITAL_COMMUNITY): Payer: BLUE CROSS/BLUE SHIELD | Admitting: Psychology

## 2016-02-29 DIAGNOSIS — F1520 Other stimulant dependence, uncomplicated: Secondary | ICD-10-CM

## 2016-02-29 DIAGNOSIS — F411 Generalized anxiety disorder: Secondary | ICD-10-CM

## 2016-02-29 DIAGNOSIS — F1120 Opioid dependence, uncomplicated: Secondary | ICD-10-CM

## 2016-02-29 DIAGNOSIS — F4310 Post-traumatic stress disorder, unspecified: Secondary | ICD-10-CM

## 2016-03-01 ENCOUNTER — Other Ambulatory Visit (HOSPITAL_COMMUNITY): Payer: BLUE CROSS/BLUE SHIELD | Attending: Psychiatry | Admitting: Psychology

## 2016-03-01 ENCOUNTER — Other Ambulatory Visit (HOSPITAL_COMMUNITY): Payer: Self-pay | Admitting: Medical

## 2016-03-01 ENCOUNTER — Encounter (HOSPITAL_COMMUNITY): Payer: Self-pay | Admitting: Psychology

## 2016-03-01 DIAGNOSIS — F1994 Other psychoactive substance use, unspecified with psychoactive substance-induced mood disorder: Secondary | ICD-10-CM | POA: Insufficient documentation

## 2016-03-01 DIAGNOSIS — F4310 Post-traumatic stress disorder, unspecified: Secondary | ICD-10-CM

## 2016-03-01 DIAGNOSIS — F1120 Opioid dependence, uncomplicated: Secondary | ICD-10-CM

## 2016-03-01 DIAGNOSIS — F1021 Alcohol dependence, in remission: Secondary | ICD-10-CM | POA: Insufficient documentation

## 2016-03-01 DIAGNOSIS — G894 Chronic pain syndrome: Secondary | ICD-10-CM | POA: Insufficient documentation

## 2016-03-01 DIAGNOSIS — Z79899 Other long term (current) drug therapy: Secondary | ICD-10-CM | POA: Insufficient documentation

## 2016-03-01 DIAGNOSIS — F17210 Nicotine dependence, cigarettes, uncomplicated: Secondary | ICD-10-CM | POA: Insufficient documentation

## 2016-03-01 DIAGNOSIS — G4700 Insomnia, unspecified: Secondary | ICD-10-CM | POA: Insufficient documentation

## 2016-03-01 DIAGNOSIS — F3289 Other specified depressive episodes: Secondary | ICD-10-CM | POA: Insufficient documentation

## 2016-03-01 DIAGNOSIS — F1520 Other stimulant dependence, uncomplicated: Secondary | ICD-10-CM | POA: Insufficient documentation

## 2016-03-01 MED ORDER — QUETIAPINE FUMARATE 25 MG PO TABS: 25.0000 mg | ORAL_TABLET | Freq: Every day | ORAL | 2 refills | Status: DC

## 2016-03-01 NOTE — Progress Notes (Signed)
    Daily Group Progress Note  Program: CD-IOP   03/01/2016 Catherine Joyce [id number]  Diagnosis:  Opioid use disorder, severe, dependence ([facility])  Amphetamine use disorder, severe, dependence ([place])  PTSD (post-traumatic stress disorder)  GAD (generalized anxiety disorder)   Sobriety Date: 12/11/15  Group Time: 1-2:30PM  Participation Level: Active  Behavioral Response: Appropriate and Sharing  Type of Therapy: Process Group  Interventions: CBT and Solution Focused  Topic: Patients were active and engaged for process session and check-in. One patient shared she had used last night. One patient was absent w/o notice. Patients discussed their challenges and "speed bumps" in recovery. Patients shared openly and gave each other feedback about pursuing jobs, sponsorship in 12 steps, and symptoms of PAWS.     Group Time: 2:30-4pm  Participation Level: Active  Behavioral Response: Appropriate and Sharing  Type of Therapy: Psycho-education Group  Interventions: CBT and Assertiveness Training  Topic: Patients were active and engaged in a discussion about "Communication in early recovery". Counselor led discussion with handout on "obstacles and goals of assertive communication". Patients were able to link their experiences of struggling to communicate their needs and learned from each other's past mistakes. Some patients stated the handout was very helpful.    Summary: Patient continues to make progress by maintaining early sobriety, having all negative UDS, increasing her feelings of positivity and reporting healthier communication with her husband and parents. She stated she attended 2 AA meetings since last group and she is talking to her sponsor daily. She reported that she "needed to come clean to the group" that she had been emotionally involved with someone in recovery but it only lasted a few days and she has since ended any contact with him and deleted his number. She  admitted it was against her beliefs and it was a lapse in sound judgment. She stated she was "trying to get her emotional needs met by someone other than her husband" but has since been talking with her husband more openly about her needs. She stated she has attended 50 12 step meetings since entering tx which made her feel proud. She continues a trend of offering insightful feedback to other group members. She stated she is working towards maintaining a healthy schedule.   UDS collected: No Results: negative  AA/NA attended?: YesMonday and Tuesday  Sponsor?: Yes   Brandon Melnick, LCAS 03/01/2016 10:36 AM

## 2016-03-04 ENCOUNTER — Encounter (HOSPITAL_COMMUNITY): Payer: Self-pay | Admitting: Psychology

## 2016-03-04 NOTE — Progress Notes (Signed)
    Daily Group Progress Note  Program: CD-IOP   03/04/2016 Catherine Joyce [id number]  Diagnosis:  No diagnosis found.   Sobriety Date: 12/11/15  Group Time: 1-2:30pm  Participation Level: Active  Behavioral Response: Appropriate  Type of Therapy: Process Group  Interventions: Supportive  Topic: Counselors met with patients to discuss the activities they had engaged in to support their recovery. All members were engaged in the discussion concerning recovery from mind-altering drugs and alcohol.  Group Time: 2:30-4pm  Participation Level: Active  Behavioral Response: Sharing  Type of Therapy: Activity Group  Interventions: Assertiveness Training  Topic: Group leaders provided information around effective and assertive communication skills. Group members' role played certain recovery related scenarios, and engaged in supportive feedback and reflection on their efforts. Drug tests were collected from several members.    Summary: The patient presented as active and very engaged throughout group. She spoke about feeling validated by her parents' support of her going to a recovery conference out of town next month. Patient also disclosed to the group concerns about seeing her husband over the weekend, though reported developing concrete plan should she need to leave and asking for support from her sponsor. Patient also shared being triggered by the shape and size of a prescribed medication, as it reminded her of Adderall.  She reported use of thought re-directing and attending a meeting to cope with trigger. Patient willingly engaged in role-plays and providing feedback, and demonstrated particular insight around setting boundaries in family relationships. Patient reported attending one NA meeting since the group last met. She responded well to this intervention.   UDS collected: No Results:  AA/NA attended?: YesWednesday  Sponsor?: Yes   Brandon Melnick, LCAS 03/04/2016 10:52 AM

## 2016-03-04 NOTE — Progress Notes (Signed)
Catherine Joyce is a 31 y.o. female patient. CD-IOP: Treatment Plan Update. Met with the patient this morning as scheduled. She is heading to [facility] tomorrow morning, so we moved up our weekly session to today. I explained the need to review her treatment goals and she was very agreeable. The patient has made measurable progress relative to her #1 goal of treatment. She has remained abstinent since she entered the residential treatment program in [place]. Her sobriety date remains 11/12. Her second goal relates to building recovery support and she ahs made significant progress towards this goal. The patient has immersed herself in the fellowship of AA, has a sponsor and has begun addressing the Fourth Step. As she mentioned today, "I have friends", which is something she has never had before. Her third goal addresses her poor self-esteem and poor senses of self, but today, the patient displays a confidence and a newfound willingness to set boundaries and enforce them. The remainder of our session focused on her weekend plans to visit her husband in [place]. She will travel with their infant son to [place], where her husband will meet her and the three of them will travel back home to [place]. The patient shared about her plans should she be triggered or find herself wanting to seek drugs. Most of the time she lived there, the patient was using. She will speak with her sponsor and others in her support network. She intends to focus on spending time with her husband and packing up her things at their rented condo to move to the [place] area in the future. The patient has repeated her intentions to 'never go back to [place]' once she leaves after this weekend. The patient has a good understanding of her daily recovery needs and displays a strong commitment to a new lifestyle of sobriety. She responded well to this intervention and we will follow up on Monday in group with her report of this past weekend.          Brandon Melnick, LCAS

## 2016-03-05 ENCOUNTER — Other Ambulatory Visit (HOSPITAL_COMMUNITY): Payer: BLUE CROSS/BLUE SHIELD

## 2016-03-05 ENCOUNTER — Encounter (HOSPITAL_COMMUNITY): Payer: Self-pay | Admitting: Psychology

## 2016-03-05 ENCOUNTER — Telehealth (HOSPITAL_COMMUNITY): Payer: Self-pay | Admitting: Psychology

## 2016-03-05 NOTE — Progress Notes (Signed)
    Daily Group Progress Note  Program: CD-IOP   03/05/2016 Catherine Joyce [id number]  Diagnosis:  Opioid use disorder, severe, dependence ([place])  Insomnia disorder, with non-sleep disorder mental comorbidity, persistent  Medication management   Sobriety Date:   Group Time: 1-2:30  Participation Level: Active  Behavioral Response: Appropriate and Sharing  Type of Therapy: Process Group  Interventions: CBT and Solution Focused  Topic: Process: Patients shared about their recovery from mind-altering drugs. They stated challenges and successes in early recovery. One new member was present and shared that her sobriety date was today. Some patients met with program director Darlyne Russian (attached note). Most patients were very active and engaged during process session.     Group Time: 2:30-4  Participation Level: Active  Behavioral Response: Appropriate and Sharing  Type of Therapy: Psycho-education Group  Interventions: Other: Sprituality  Topic: Three [facility] chaplains were present and led an experiential activity with a "listening rock" about listening w/o interruptions. Exercise was stated as being adapted for all beliefs, from [place] practices. Few members shared but all appeared engaged with exercise in a "working silence".   Summary: Patient was active, engaged, euthymic during session. She met with program director to discuss medication management. She reported she attended 3 12 step meetings. She stated she was feeling regretful regarding her feelings in group on Monday since she was very "disparaging of her husband". She stated she is hopeful that something good will happen and she is trusting her higher power to help her resolve the conflict. She stated that her sleep is still irregular though she did not exhibit any symptoms of seeming tired. Pt identified her "bad mood" is linked to spending too much time in the house and she is enjoying getting to  meetings and meeting new people in recovery. Pt stated she feels rejuvenated after learning she "has permission to let go" of her worries concerning her marriage. Pt stated she hopes to lower her anxiety by avoiding the stress of talking to her husband for long periods and giving him explicit expectations for their future together. UDS results were return for this pt and all results were negative.    UDS collected: No Results: negative  AA/NA attended?: YesMonday and Tuesday  Sponsor?: Yes   Brandon Melnick, LCAS 03/05/2016 8:32 AM

## 2016-03-07 ENCOUNTER — Other Ambulatory Visit (HOSPITAL_COMMUNITY): Payer: BLUE CROSS/BLUE SHIELD | Admitting: Psychology

## 2016-03-07 DIAGNOSIS — F4310 Post-traumatic stress disorder, unspecified: Secondary | ICD-10-CM

## 2016-03-07 DIAGNOSIS — F1120 Opioid dependence, uncomplicated: Secondary | ICD-10-CM

## 2016-03-07 DIAGNOSIS — F1520 Other stimulant dependence, uncomplicated: Secondary | ICD-10-CM

## 2016-03-08 ENCOUNTER — Encounter (HOSPITAL_COMMUNITY): Payer: Self-pay

## 2016-03-08 ENCOUNTER — Other Ambulatory Visit (HOSPITAL_COMMUNITY): Payer: BLUE CROSS/BLUE SHIELD

## 2016-03-09 ENCOUNTER — Encounter (HOSPITAL_COMMUNITY): Payer: Self-pay | Admitting: Psychology

## 2016-03-09 NOTE — Progress Notes (Signed)
    Daily Group Progress Note  Program: CD-IOP   03/09/2016 Catherine Joyce [id number]  Diagnosis:  No diagnosis found.   Sobriety Date: 11/12  Group Time: 1-2:30pm  Participation Level: Active  Behavioral Response: Appropriate  Type of Therapy: Process Group  Interventions: Supportive  Topic: Process: Counselors met with patients to discuss the activities they had engaged in to support their recovery. All members were engaged in the discussion concerning recovery from mind-altering drugs and alcohol.  Group Time: 2:30-4pm  Participation Level: Active  Behavioral Response: Sharing  Type of Therapy: Psycho-education Group  Interventions: Strength-based  Topic: Psychoeducation: Group leaders provided information around creating individual relapse prevention plans, which included tips for avoiding relapse. Group members discussed coping skills, social support, and outcomes of relapse and sobriety. Drug tests were collected from several members.     Summary: The patient presented as active and very engaged throughout group. She shared deciding to not go to [place] by accepting advice from sponsor and others in her program; patient plans to go with several other members of AA to move things out of husband's apartment. Her husband questioned this decision by stating she has enough recovery to do so on her own, but patient reported standing her ground about her needs with this. Patient seems to be able to be assertive with her husband, despite his tendencies to communicate passive aggressively. She spoke about being very triggered when her son is upset or hurt, and often copes with this by reminding self that relapse would hurt her son more. Patient is also triggered by intense emotions, such as depression, and often uses HALT as a way of self-monitoring. She had insight into another patient's struggle with "future tripping," and often demonstrates support to other members of the  group. Patient reported attending 7 meetings since last Thursday.   UDS collected: Yes Results: negative  AA/NA attended?: YesThursday, Friday, Saturday and Sunday  Sponsor?: Yes   Brandon Melnick, LCAS 03/09/2016 1:22 PM

## 2016-03-12 ENCOUNTER — Other Ambulatory Visit (HOSPITAL_COMMUNITY): Payer: Self-pay | Admitting: Medical

## 2016-03-12 ENCOUNTER — Other Ambulatory Visit (HOSPITAL_COMMUNITY): Payer: BLUE CROSS/BLUE SHIELD | Admitting: Psychology

## 2016-03-12 DIAGNOSIS — F1120 Opioid dependence, uncomplicated: Secondary | ICD-10-CM

## 2016-03-12 DIAGNOSIS — F411 Generalized anxiety disorder: Secondary | ICD-10-CM

## 2016-03-12 DIAGNOSIS — F5105 Insomnia due to other mental disorder: Secondary | ICD-10-CM

## 2016-03-12 DIAGNOSIS — G4700 Insomnia, unspecified: Secondary | ICD-10-CM

## 2016-03-12 DIAGNOSIS — F1520 Other stimulant dependence, uncomplicated: Secondary | ICD-10-CM

## 2016-03-12 DIAGNOSIS — F3289 Other specified depressive episodes: Secondary | ICD-10-CM

## 2016-03-12 DIAGNOSIS — F4310 Post-traumatic stress disorder, unspecified: Secondary | ICD-10-CM

## 2016-03-12 MED ORDER — LAMOTRIGINE 150 MG PO TABS: 150.0000 mg | ORAL_TABLET | Freq: Every day | ORAL | 2 refills | Status: DC

## 2016-03-13 ENCOUNTER — Encounter (HOSPITAL_COMMUNITY): Payer: Self-pay | Admitting: Psychology

## 2016-03-13 NOTE — Progress Notes (Signed)
    Daily Group Progress Note  Program: CD-IOP   03/13/2016 Catherine Joyce [id number]  Diagnosis:  Opioid use disorder, severe, dependence (HCC)  Amphetamine use disorder, severe, dependence (HCC)  PTSD (post-traumatic stress disorder)  Atypical depression  GAD (generalized anxiety disorder)  Insomnia disorder, with non-sleep disorder mental comorbidity, persistent   Sobriety Date: 12/11/15  Group Time: 1-2:30  Participation Level: Active  Behavioral Response: Appropriate and Sharing  Type of Therapy: Process Group  Interventions: CBT and DBT  Topic: Patients appeared for 1.5 hour group process session focused on recovery. Pt were instructed to check in with a shining moment and challenge to their sobriety since last group. Patients shared openly and offered feedback to other members. UDS were collected from 2 new members who attended their first group. Some patients met with Darlyne Russian, medical director (see attached notes).      Group Time: 2:30-4  Participation Level: Active  Behavioral Response: Appropriate and Sharing  Type of Therapy: Psycho-education Group  Interventions: Family Systems  Topic: Patients appeared for 1.5 hour group psychoeducation session in which the counselor led a creative art intervention pertaining to family history. Patients were instructed to use a poem format to explore "Where I am From" through poetry. Patients wrote for 10 minutes and then were asked to share their poems in front of group members. Multiple members became tearful throughout session and it appeared to be very powerful and increased group cohesion and vulnerability.    Summary: Patient stated she attended 4 AA meetings since last group. She was excited to report she had been asked to help co chair a meeting at a local tx facility. She reported she has 90 days and is feeling very hopeful. She reported having a positive weekend with her son since she felt a new sense  of engagement and ability to provide for his needs. She reported she is no longer texting important conversations with her husband and choses instead to talk via phone. She continues to do daily mediation, meeting attendance, and calling sponsor. She appears to be sustaining recovery and increasing her insight into addiction daily. Patient admitted she had trauma in childhood from pressure by her mother to get "straight A's in everything all the time". She admitted it contributes to a feeling of inadequacy and guilt in adulthood.    UDS collected: No Results: negative  AA/NA attended?: YesMonday, Friday, Saturday and Sunday  Sponsor?: Yes   Brandon Melnick, LCAS 03/13/2016 4:00 PM

## 2016-03-14 ENCOUNTER — Other Ambulatory Visit (INDEPENDENT_AMBULATORY_CARE_PROVIDER_SITE_OTHER): Payer: BLUE CROSS/BLUE SHIELD | Admitting: Psychology

## 2016-03-14 DIAGNOSIS — F1520 Other stimulant dependence, uncomplicated: Secondary | ICD-10-CM

## 2016-03-14 DIAGNOSIS — F1120 Opioid dependence, uncomplicated: Secondary | ICD-10-CM | POA: Diagnosis not present

## 2016-03-14 DIAGNOSIS — G4700 Insomnia, unspecified: Secondary | ICD-10-CM | POA: Diagnosis not present

## 2016-03-14 DIAGNOSIS — F3289 Other specified depressive episodes: Secondary | ICD-10-CM

## 2016-03-14 DIAGNOSIS — F5105 Insomnia due to other mental disorder: Secondary | ICD-10-CM

## 2016-03-14 DIAGNOSIS — F1994 Other psychoactive substance use, unspecified with psychoactive substance-induced mood disorder: Secondary | ICD-10-CM

## 2016-03-14 DIAGNOSIS — G894 Chronic pain syndrome: Secondary | ICD-10-CM

## 2016-03-14 DIAGNOSIS — F4310 Post-traumatic stress disorder, unspecified: Secondary | ICD-10-CM

## 2016-03-14 DIAGNOSIS — Z8659 Personal history of other mental and behavioral disorders: Secondary | ICD-10-CM

## 2016-03-15 ENCOUNTER — Encounter (HOSPITAL_COMMUNITY): Payer: Self-pay | Admitting: Medical

## 2016-03-15 ENCOUNTER — Other Ambulatory Visit (HOSPITAL_COMMUNITY): Payer: BLUE CROSS/BLUE SHIELD | Admitting: Psychology

## 2016-03-15 DIAGNOSIS — F1120 Opioid dependence, uncomplicated: Secondary | ICD-10-CM

## 2016-03-15 DIAGNOSIS — F1520 Other stimulant dependence, uncomplicated: Secondary | ICD-10-CM

## 2016-03-15 DIAGNOSIS — F4310 Post-traumatic stress disorder, unspecified: Secondary | ICD-10-CM

## 2016-03-15 NOTE — Progress Notes (Signed)
MED CHECK-Pt requested increase in lamictal for mood-sent to Pharmacy-she hasnt started yet.Sleep is better now FU PRN

## 2016-03-17 ENCOUNTER — Encounter (HOSPITAL_COMMUNITY): Payer: Self-pay | Admitting: Psychology

## 2016-03-17 NOTE — Progress Notes (Signed)
Daily Group Progress Note  Program: CD-IOP   03/17/2016 Catherine Joyce [id number]  Diagnosis:  No diagnosis found.   Sobriety Date: 12/11/15  Group Time: 1-2:30pm  Participation Level: Active  Behavioral Response: Sharing  Type of Therapy: Process Group  Interventions: Supportive  Topic: Process: Counselors met with patients to discuss the activities they had engaged in since the previous group to support their recovery. The counselors created space for group members to express their feelings regarding the recent transitions the group had undergone. The counselors were able to clarify expectations of group members and facilitate a psycho-ed on group norms. All members were engaged in the discussion regarding recovery from mind-altering drugs and alcohol.   Group Time: 2:30-4pm  Participation Level: Active  Behavioral Response: Sharing  Type of Therapy: Psychoeducation  Interventions: activity  Topic: Psychoeducation: Counselors facilitated a team building exercise in light of the recent group transitions. There was also a brief psycho-ed on emotional regulation and expression. In the past week, there have been three new group members, two departures, and two upcoming departures. The last fifteen minutes were dedicated to allowing the group to share their hopes for recovery for two group members transitioning to treatment for co-occurring disorders.   Summary: The client was active and engaged in group.  She discussed recent group dynamics and how they had affected her. She shared that she appreciated the "accountability" that the group provides. She is anticipating working through future steps, particularly with making amends. She shared about her experiences with "emotional stuffing" and how she is working toward actively sharing her emotions in the present moment rather than letting them become resentments. The patient attended one NA meeting.   UDS collected: No Results:    AA/NA attended?: YesWednesday  Sponsor?: Yes    , LCAS 03/17/2016 2:47 PM 

## 2016-03-18 NOTE — Progress Notes (Signed)
    Daily Group Progress Note  Program: CD-IOP   03/18/2016 Catherine Joyce [id number]  Diagnosis:  Opioid use disorder, severe, dependence (HCC)  PTSD (post-traumatic stress disorder)  Amphetamine use disorder, severe, dependence (H[facility])  Insomnia disorder, with non-sleep disorder mental comorbidity, persistent  Chronic pain syndrome  History of attention deficit hyperactivity disorder (ADHD)  Atypical depression - ?Bipolar  Substance induced mood disorder ([place])   Sobriety Date: 11/12  Group Time: 1-2:30pm  Participation Level: Active  Behavioral Response: Appropriate and Sharing  Type of Therapy: Process Group  Interventions: Supportive  Topic: Process: Counselors met with patients to discuss the activities they had engaged in to support their recovery. All members were engaged in the discussion concerning recovery from mind-altering drugs and alcohol.  Group Time: 2:30-4pm  Participation Level: Active  Behavioral Response: Appropriate  Type of Therapy: Psycho-education Group  Interventions: Family Systems  Topic: Psychoeducation: Group leaders provided information around family roles in addicted families, and the group processed which roles they may identify with. Drug tests were collected from several members.    Summary: The patient presented as active and engaged throughout group. 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 She also spoke about needing to be assertive about her recovery when her dentist offered her benzos to help with anxiety for dental work appointment. Patient refused the prescription, though is considering the use of laughing gas. Patient offered feedback about the importance of sober friends in recovery to another group member, who seemed receptive to this. Concerning family roles, she most identified with the role of enabler in her marriage and the hero in her childhood. Patient reported attending two meetings since we last  met.   UDS collected: Yes Results: negative  AA/NA attended?: YesMonday and Tuesday  Sponsor?: Yes   Brandon Melnick, LCAS 03/18/2016 9:57 AM

## 2016-03-19 ENCOUNTER — Other Ambulatory Visit (INDEPENDENT_AMBULATORY_CARE_PROVIDER_SITE_OTHER): Payer: BLUE CROSS/BLUE SHIELD | Admitting: Psychology

## 2016-03-19 ENCOUNTER — Encounter (HOSPITAL_COMMUNITY): Payer: Self-pay | Admitting: Medical

## 2016-03-19 ENCOUNTER — Other Ambulatory Visit (HOSPITAL_COMMUNITY): Payer: Self-pay | Admitting: Medical

## 2016-03-19 DIAGNOSIS — F5105 Insomnia due to other mental disorder: Secondary | ICD-10-CM

## 2016-03-19 DIAGNOSIS — F4310 Post-traumatic stress disorder, unspecified: Secondary | ICD-10-CM

## 2016-03-19 DIAGNOSIS — G4700 Insomnia, unspecified: Secondary | ICD-10-CM | POA: Diagnosis not present

## 2016-03-19 DIAGNOSIS — F1520 Other stimulant dependence, uncomplicated: Secondary | ICD-10-CM

## 2016-03-19 DIAGNOSIS — F1120 Opioid dependence, uncomplicated: Secondary | ICD-10-CM

## 2016-03-19 DIAGNOSIS — F3289 Other specified depressive episodes: Secondary | ICD-10-CM

## 2016-03-19 MED ORDER — LAMOTRIGINE 200 MG PO TABS: 200.0000 mg | ORAL_TABLET | Freq: Every day | ORAL | 2 refills | Status: DC

## 2016-03-19 MED ORDER — QUETIAPINE FUMARATE 50 MG PO TABS: 50.0000 mg | ORAL_TABLET | Freq: Every day | ORAL | 2 refills | Status: DC

## 2016-03-19 NOTE — Progress Notes (Signed)
Med check-pt neding Rx resent for Lamictal and seroquel

## 2016-03-20 NOTE — Progress Notes (Signed)
    Daily Group Progress Note  Program: CD-IOP   03/20/2016 Donna Bernard [id number]  Diagnosis:  Opioid use disorder, severe, dependence (HCC)  Amphetamine use disorder, severe, dependence (HCC)  PTSD (post-traumatic stress disorder)  Insomnia disorder, with non-sleep disorder mental comorbidity, persistent  Atypical depression   Sobriety Date: 12/11/15  Group Time: 1-2:30pm  Participation Level: Active  Behavioral Response: Appropriate and Sharing  Type of Therapy: Process Group  Interventions: Supportive  Topic: Process: the first half of group was spent in process. Members shared about the past weekend and the 'shining moments' and 'speedbumps' that presented themselves along the way. A new group member was present, and he introduced himself and shared about his drinking had what had brought him here. A drug test was collected from several group members. The medical director met with two people during group today. One member arrived halfway through group and although she could stay, she will not be given credit for this group.   Group Time: 2:30-4pm  Participation Level: Active  Behavioral Response: Appropriate  Type of Therapy: Psycho-education Group  Interventions: Family Systems  Topic: Psycho-Ed: "The Laundry List". A psycho-ed was presented following process. A handout was provided identifying the 14 traits of an ACOA. These traits are very typical of anyone raised in a dysfunctional family system. Group members read over the handout and discussed traits that they identified within themselves. There was a lively conversation about seeking the approval of others and the fears of criticism. Most members were able to identify having many of these traits themselves. The handout also identified the traits one can embrace and practice to heal or change these dysfunction characteristics.   Summary: The patient reported she attended three [facility] meetings since we last met.  She went to two on Friday because she knew she would not be able to go on Saturday. The patient traveled down to [place] with her sponsor to get her things and pick up her car. She and her husband are giving up their rental and he will move in with his parents for the time being. The patient admitted she was surprised when she got there because almost all of the furniture had been moved to the storage space they had rented. There was only a blow-up mattress for her and her husband. After talking it over and collecting her things, the sponsor drove back to [place] in the patient's car. The patient reported her husband was emotionally labile and went from anger to being nice about three or four times in one evening. His lability did not bother her much and he seemed "upset that I wasn't getting upset'. In the psycho-ed, the patient identified traits that she possesses and shared about how her father expected her to take care of him. The patient agreed it was very odd and now that she is an adult. She resents having to do that and wonders why her mother was not more involved in his care. The patient reported, "I have always isolated, even as a kid" and she could relate to the people pleasing and avoiding conflict. She displayed good insight and provided helpful feedback to the member who had relapsed. The patient responded well to this intervention.    UDS collected: Yes Results: pending  AA/NA attended?: YesFriday and Sunday  Sponsor?: Yes   Brandon Melnick, LCAS 03/20/2016 10:49 AM

## 2016-03-21 ENCOUNTER — Other Ambulatory Visit (HOSPITAL_COMMUNITY): Payer: BLUE CROSS/BLUE SHIELD | Admitting: Psychology

## 2016-03-21 DIAGNOSIS — F1520 Other stimulant dependence, uncomplicated: Secondary | ICD-10-CM

## 2016-03-21 DIAGNOSIS — F4310 Post-traumatic stress disorder, unspecified: Secondary | ICD-10-CM

## 2016-03-21 DIAGNOSIS — F1120 Opioid dependence, uncomplicated: Secondary | ICD-10-CM

## 2016-03-21 DIAGNOSIS — F3289 Other specified depressive episodes: Secondary | ICD-10-CM

## 2016-03-22 ENCOUNTER — Other Ambulatory Visit (HOSPITAL_COMMUNITY): Payer: BLUE CROSS/BLUE SHIELD | Admitting: Psychology

## 2016-03-22 DIAGNOSIS — F1520 Other stimulant dependence, uncomplicated: Secondary | ICD-10-CM

## 2016-03-22 DIAGNOSIS — F1120 Opioid dependence, uncomplicated: Secondary | ICD-10-CM

## 2016-03-22 DIAGNOSIS — F1994 Other psychoactive substance use, unspecified with psychoactive substance-induced mood disorder: Secondary | ICD-10-CM

## 2016-03-22 DIAGNOSIS — F3289 Other specified depressive episodes: Secondary | ICD-10-CM

## 2016-03-22 DIAGNOSIS — F4310 Post-traumatic stress disorder, unspecified: Secondary | ICD-10-CM

## 2016-03-23 NOTE — Progress Notes (Signed)
    Daily Group Progress Note  Program: CD-IOP   03/23/2016 Donna Bernard [id number]  Diagnosis:  Opioid use disorder, severe, dependence (HCC)  Amphetamine use disorder, severe, dependence (HCC)  PTSD (post-traumatic stress disorder)  Atypical depression  Substance induced mood disorder ([place])   Sobriety Date: 12/11/15  Group Time: 1-2:30  Participation Level: Active  Behavioral Response: Appropriate and Sharing  Type of Therapy: Process Group  Interventions: CBT and Solution Focused  Topic: Counselors met with patients to discuss the activities they had engaged in since the previous group to support their recovery. Patients were asked to check in with a shining moment and a speed bump they had experienced since the previous session. All members were active and engaged in the discussion regarding recovery from mind-altering drugs and alcohol.     Group Time: 2:30-4  Participation Level: Active  Behavioral Response: Appropriate and Sharing  Type of Therapy: Psycho-education Group  Interventions: Meditation: Mindfulness  Topic: The counselors led a group on mindfulness as a coping skill. Patients participated in a mindful breathing exercise, mindful eating, and a mindful coloring activity. Patients were asked to process how effective these exercises had been for them and how they connected mindfulness to their addictions. Patients were asked to practice one mindfulness activity over the weekend and to report back on how it had gone during the next group. No drug tests were collected from the patients.   Summary: The patient was active and engaged in group. She provided helpful feedback to other group members regarding sponsorship and work stresses. After hearing another group member's story she reported feeling "emotional" and teared up. She shared that she had received some passive-aggressive comments from her husband regarding their marriage and she stated that she  wants to set clear expectations with him. The patient left halfway through the group as she was going out of town for an [employer]. The patient attended 1 12-step meeting since the previous session.   UDS collected: No Results: 2/19 negative  AA/NA attended?: YesWednesday and Thursday  Sponsor?: Yes   Brandon Melnick, LCAS 03/23/2016 9:40 AM

## 2016-03-26 ENCOUNTER — Encounter (HOSPITAL_COMMUNITY): Payer: Self-pay | Admitting: Psychology

## 2016-03-26 ENCOUNTER — Other Ambulatory Visit (INDEPENDENT_AMBULATORY_CARE_PROVIDER_SITE_OTHER): Payer: BLUE CROSS/BLUE SHIELD | Admitting: Psychology

## 2016-03-26 ENCOUNTER — Encounter (HOSPITAL_COMMUNITY): Payer: Self-pay | Admitting: Medical

## 2016-03-26 DIAGNOSIS — F4310 Post-traumatic stress disorder, unspecified: Secondary | ICD-10-CM

## 2016-03-26 DIAGNOSIS — F1994 Other psychoactive substance use, unspecified with psychoactive substance-induced mood disorder: Secondary | ICD-10-CM

## 2016-03-26 DIAGNOSIS — F1120 Opioid dependence, uncomplicated: Secondary | ICD-10-CM

## 2016-03-26 DIAGNOSIS — G894 Chronic pain syndrome: Secondary | ICD-10-CM | POA: Diagnosis not present

## 2016-03-26 DIAGNOSIS — F3289 Other specified depressive episodes: Secondary | ICD-10-CM | POA: Diagnosis not present

## 2016-03-26 DIAGNOSIS — F5105 Insomnia due to other mental disorder: Secondary | ICD-10-CM

## 2016-03-26 DIAGNOSIS — F17210 Nicotine dependence, cigarettes, uncomplicated: Secondary | ICD-10-CM

## 2016-03-26 DIAGNOSIS — G4700 Insomnia, unspecified: Secondary | ICD-10-CM | POA: Diagnosis not present

## 2016-03-26 DIAGNOSIS — F1021 Alcohol dependence, in remission: Secondary | ICD-10-CM

## 2016-03-26 DIAGNOSIS — F1520 Other stimulant dependence, uncomplicated: Secondary | ICD-10-CM | POA: Diagnosis not present

## 2016-03-26 DIAGNOSIS — Z79899 Other long term (current) drug therapy: Secondary | ICD-10-CM | POA: Diagnosis not present

## 2016-03-26 NOTE — Progress Notes (Signed)
    Daily Group Progress Note  Program: CD-IOP   03/26/2016 Catherine Joyce [id number]  Diagnosis:  Opioid use disorder, severe, dependence (HCC)  Amphetamine use disorder, severe, dependence (HCC)  PTSD (post-traumatic stress disorder)  Atypical depression   Sobriety Date: 12/11/15  Group Time: 1-2:30  Participation Level: Active  Behavioral Response: Appropriate and Sharing  Type of Therapy: Process Group  Interventions: CBT and Solution Focused  Topic: Counselors met with patients to discuss the activities they had engaged in to support their recovery. All members were engaged in the discussion concerning recovery from mind-altering drugs and alcohol.     Group Time: 2:30-4  Participation Level: Active  Behavioral Response: Appropriate and Sharing  Type of Therapy: Psycho-education Group  Interventions: CBT, Solution Focused and Strength-based  Topic: Group leaders provided information around codependency, and how codependent traits and behaviors can influence both use and recovery. Group discussed how childhood and family environments can influence the development of codependent behaviors, and in turn how this shapes adult relationships currently. Discussion was closed with how to change codependent behaviors, which focused on boundaries, communication, self-worth, and Higher Power. Drug tests were collected from several members.    Summary: The patient presented as active and engaged throughout group. She shared briefly about experiencing grief over the state of her marriage, though this may be helpful to continue processing in group. Patient was particularly frustrated with one of her home group's idea to only be inclusive to those who primarily identify as alcoholics, and seemed to be the most distressed around how this is not inclusive or supportive for all. She also offered insight into how comparing self to others in groups or meetings is the disease attempting  to provide uniqueness or differentness, thus distancing self from support. With regards to codependency, patient related to feeling codependent in her marriage and active addiction. She shared the insight that codependency is "like letting another person be your Higher Power," and many group members resonated with this statement. Patient reported attending 2 meetings.   UDS collected: No Results: negative  AA/NA attended?: YesWednesday  Sponsor?: Yes   Brandon Melnick, LCAS 03/26/2016 8:33 AM

## 2016-03-26 NOTE — Progress Notes (Signed)
    Daily Group Progress Note  Program: CD-IOP   03/26/2016 Catherine Joyce [id number]  Diagnosis:  Opioid use disorder, severe, dependence (HCC)  Amphetamine use disorder, severe, dependence ([place])  Alcohol dependence in remission ([place])  Atypical depression  Substance induced mood disorder (HCC)  PTSD (post-traumatic stress disorder)  Insomnia disorder, with non-sleep disorder mental comorbidity, persistent  Chronic pain syndrome  Cigarette smoker   Sobriety Date: 12/11/15  Group Time: 1-2:30  Participation Level: Active  Behavioral Response: Appropriate and Sharing  Type of Therapy: Process Group  Interventions: CBT and Solution Focused  Topic: Counselor met with patients for 1.5 hour group process session. Patients were active and engaged in discussion about their recovery from mind-altering chemicals. One patient met with program director for intake. One pt met with program director for discharge. 2 UDS were collected from pts. Counselor asked about homework assignment from previous session in which pt were advised to attempt mindfulness over the weekend.      Group Time: 2:30-4  Participation Level: Active  Behavioral Response: Appropriate and Sharing  Type of Therapy: Psycho-education Group  Interventions: Strength-based, Supportive and Reframing  Topic: Counselor met with patients for 1.5 hour group psychoeducation session discussing "healthy reward" in recovery and how to reframe success. Patients addressed black and white thinking and brainstormed about differing ways to treat themselves when they resist drugs or alcohol. Patients grouped up into pairs and discussed options. Patients were given homework assignment of writing a "goodbye letter to my addiction".    Summary: Patient was active and engaged in group discussion. She stated she attended an [facility] conference over the weekend which was "full of lots of meetings". She stated the conference was very  helpful and she was able to learn about addiction and recovery. She stated she is working on setting firm boundaries and expectations with her husband who is complaining that she is "being mean". Pt stated she knows "she is not being mean but is being firm, which is different". Patient stated she is intending on graduating from CD-IOP in 2 days and has plans to participate in extra 12 step meetings due to the upcoming transition out of tx.    UDS collected: No Results: negative  AA/NA attended?: YesFriday, Saturday and Sunday  Sponsor?: Yes   Brandon Melnick, LCAS 03/26/2016 4:37 PM

## 2016-03-26 NOTE — Progress Notes (Signed)
[facility] Chemical Dependency Intensive Outpatient Discharge Summary   Catherine Joyce [id number]  Date of Admission: 01/17/2016 Date of Discharge: 03/28/2016  Visit Diagnoses      ICD-9-CM ICD-10-CM                   1.   Opioid use disorder, severe, dependence (HCC) 304.00 F11.20       2.   Amphetamine use disorder, severe, dependence (HCC) 304.40 F15.20       3.   Alcohol dependence in remission (HCC) 303.93 F10.21       4.   Atypical depression 296.82 F32.89       5.   Substance induced mood disorder (HCC) 292.84 F19.94       6.   PTSD (post-traumatic stress disorder) 309.81 F43.10       7.   Insomnia disorder, with non-sleep disorder mental comorbidity, persistent 780.52 G47.00       8.   Chronic pain syndrome 338.4 G89.4       9.   Cigarette smoker 305.1 F17.210        Course of Treatment: CDIOP aftercare s/p D/C from Inpt treatment at  [facility] in [place] for dual diagnosis Opiate dependence and Atypical Depression/ ?Bipolar. And Complex PTSD Pt has done very well with successful completion of the requirements of the program going well beyond compliance to acceptance of her condition and willingness to do work necessary to bring about long term recovery. She continues to deal with a husband who has pretended recovery to her while using opiates;taking money out of bank accounts ;saying he will go to treatment then not going.She has accepted this as fact rather than excuse/reason for her to use. She did not fel need for anticraving medications /MAT given her psychiatric medications were working .  Medications:    buPROPion (WELLBUTRIN XL) 300 MG 24 hr tablet 300 mg, Daily       cloNIDine (CATAPRES) 0.2 MG tablet 0.2 mg, Daily at bedtime       gabapentin (NEURONTIN) 300 MG capsule 300 mg, 2 times daily       lamoTRIgine (LAMICTAL) 200 MG tablet 200 mg, Daily       naltrexone (DEPADE) 50 MG tablet 50 mg, Daily       nicotine (NICODERM CQ) 7 mg/24hr patch 7 mg, Daily        propranolol (INDERAL) 10 MG tablet        QUEtiapine (SEROQUEL) 50 MG tablet 50 mg, Daily at bedtime       sertraline (ZOLOFT) 100 MG tablet 100 mg, Daily         Goals and Activities to Help Maintain Sobriety: 1. Stay away from old friends who continue to drink and use mind-altering chemicals. 2. Continue practicing Fair Fighting rules in interpersonal conflicts. 3. Continue alcohol and drug refusal skills and call on support systems. 4. Live in and maintain a safe/drug free enviornment  Referrals: Nena Jordan MD Psychiatrist [facility]  Aftercare services: at [facility] OP Tues and Weds 1. Attend AA/NA meetings at least 4 times per week. 2. Obtain a sponsor and a home group in AA/NA. 3. Return to Psychotherapist as needed/recommended at [facility]   Next appointment: as scheduled  Plan of Action to Address Continuing Problems: as above   Client has participated in the development of this discharge plan and has received a copy of this completed plan    Maryjean Morn  03/26/2016   Maryjean Morn,  PA-C 03/26/2016

## 2016-03-27 ENCOUNTER — Encounter (HOSPITAL_COMMUNITY): Payer: Self-pay | Admitting: Psychology

## 2016-03-27 NOTE — Progress Notes (Signed)
Catherine Joyce is a 31 y.o. female patient. CD-IOP: Discharge Plan. Met with patient this afternoon for her final session and to discuss her discharge plan; she will be graduating from the program tomorrow. She reported a great weekend in [facility] where she attended an [place] with her sponsor. The patient shared about her conversations with her husband and the tentative plan for him to travel to [place] this weekend and spent time with her and their 57-monthold son. They continue to differ on the plans going forward, but the patient has made it clear that she will not live with him unless he is off the suboxone. In discussing her plans as she leaves the program, the patient reported she would continue her step work with sponsor and remain active in the [facility] [employer] We are referring her [facility]. The program director knows him quite well and believes Dr. BOwens Sharkhas an excellent grasp of depression and will work well with this patient and her psychiatric history. She will be looking for a part-time job and be free to spend more time with her son. The patient has made excellent progress in her recovery and been a very powerful leader in this program. She has given herself to recovery and is working a vDance movement psychotherapist She has done everything we have asked of her in the CD-IOP and has remained drug-free and built a very extensive network of recovery among recovering women in the community. Relative to her third treatment goal, "finding a sense of peace and contentment within herself", the patient has made significant progress and reports a serenity and joy in daily life that she has not been aware of in the past. Currently, she is stable on medications, but will follow up with Dr. BOwens Sharkfor medication monitoring.  The patient is scheduled to meet with this counselor for individual counseling sessions and address more intensely, her  negative core beliefs. She recognizes her poor sense of self has fed her addictive behaviors.   The patient leaves u[place] many tools and strategies to utilize in order to remain alcohol and drug-free. She has a very clear understanding of her daily recovery needs and her long-term recovery plan. She will graduate from the CD-IOP successfully, tomorrow, March 28, 2016. Her sobriety date remains 12/11/15.        ABrandon Melnick LCAS

## 2016-03-28 ENCOUNTER — Other Ambulatory Visit (HOSPITAL_COMMUNITY): Payer: BLUE CROSS/BLUE SHIELD | Admitting: Psychology

## 2016-03-28 DIAGNOSIS — F3289 Other specified depressive episodes: Secondary | ICD-10-CM

## 2016-03-28 DIAGNOSIS — F1120 Opioid dependence, uncomplicated: Secondary | ICD-10-CM

## 2016-03-28 DIAGNOSIS — F1520 Other stimulant dependence, uncomplicated: Secondary | ICD-10-CM

## 2016-03-29 ENCOUNTER — Other Ambulatory Visit (HOSPITAL_COMMUNITY): Payer: BLUE CROSS/BLUE SHIELD

## 2016-03-30 ENCOUNTER — Encounter (HOSPITAL_COMMUNITY): Payer: Self-pay | Admitting: Psychology

## 2016-03-30 NOTE — Progress Notes (Signed)
    Daily Group Progress Note  Program: CD-IOP   03/30/2016 Catherine Joyce [id number]  Diagnosis:  No diagnosis found.   Sobriety Date: 12/11/15  Group Time: 1-2:30pm  Participation Level: Active  Behavioral Response: Appropriate and Sharing  Type of Therapy: Process Group  Interventions: Supportive  Topic: Process: the first half of group was spent in process. Members shared about their "shining moments" and "speed bumps" as well as how many 12-step meetings they had attended since the group last met. The program director met with three group members during the session today and drug tests were collected from four group members.    Group Time: 2:30-4pm  Participation Level: Active  Behavioral Response: Appropriate and Sharing  Type of Therapy: Psycho-education Group  Interventions: Strength-based  Topic: Psycho-Ed: Chaplain and Spirituality/Graduation. The second half of group was spent in a psycho-ed. A visiting chaplain, HM, provided a handout and led a discussion on spirituality. As he asked questions, members shared their own views about spirituality and how addiction blocks or destroys one's spiritual life. A lively discussion ensued with good feedback among those present. Twenty minutes before the session ended, a graduation ceremony was held to honor a member who was completing the program and graduating today. Brownies and kind words followed with tears and heartful words shared with the graduating member.   Summary: The patient reported she had attended three [place] meetings since we last met. She described her 'shining moment' as last night at an [place] meeting when a relatively new member thanked her for her kindness and support and how this had helped her remain sober. the patient provided helpful feedback in response to the group members who were identifying resistance to attending 12-step meetings. She could relate to their initial discomfort but encouraged them to keep  going into the rooms. During the psycho-ed, the patient stated that she believed the purpose of her life was to live "happy, generous and free'. She was engaged in the discussion with the chaplain and made some good comments. This patient was the member graduating today and she received very kind words of gratitude and respect from her fellow group members. her sponsor came into the room shortly before the graduation ceremony. The patient was teary as she expressed gratitude to those that have supported her in these life changes. She leaves the program with all the tools and understanding she will need to remain drug-free going forward. The patient proved to be a wonderful group member who shared freely about her foibles and character defects along with ongoing struggles which is life itself. She responded well to this intervention and moves into a new phase of her recovery-based lifestyle.    UDS collected: No Results:   AA/NA attended?: YesMonday and Tuesday  Sponsor?: Yes   Brandon Melnick, LCAS 03/30/2016 11:37 AM

## 2016-04-02 ENCOUNTER — Other Ambulatory Visit (HOSPITAL_COMMUNITY): Payer: BLUE CROSS/BLUE SHIELD

## 2016-04-04 ENCOUNTER — Other Ambulatory Visit (HOSPITAL_COMMUNITY): Payer: BLUE CROSS/BLUE SHIELD

## 2016-04-05 ENCOUNTER — Other Ambulatory Visit (HOSPITAL_COMMUNITY): Payer: BLUE CROSS/BLUE SHIELD

## 2016-04-09 ENCOUNTER — Other Ambulatory Visit (HOSPITAL_COMMUNITY): Payer: Self-pay | Admitting: Medical

## 2016-04-09 ENCOUNTER — Other Ambulatory Visit (HOSPITAL_COMMUNITY): Payer: BLUE CROSS/BLUE SHIELD

## 2016-04-10 ENCOUNTER — Encounter (HOSPITAL_COMMUNITY): Payer: Self-pay | Admitting: Psychology

## 2016-04-10 ENCOUNTER — Ambulatory Visit (HOSPITAL_COMMUNITY): Payer: BLUE CROSS/BLUE SHIELD | Admitting: Psychology

## 2016-04-10 DIAGNOSIS — F1120 Opioid dependence, uncomplicated: Secondary | ICD-10-CM

## 2016-04-10 DIAGNOSIS — F1520 Other stimulant dependence, uncomplicated: Secondary | ICD-10-CM

## 2016-04-10 DIAGNOSIS — F3289 Other specified depressive episodes: Secondary | ICD-10-CM

## 2016-04-10 NOTE — Progress Notes (Incomplete)
   THERAPIST PROGRESS NOTE  Session Time: ***  Participation Level: {BHH PARTICIPATION LEVEL:22264}  Behavioral Response: {Appearance:22683}{BHH LEVEL OF CONSCIOUSNESS:22305}{BHH MOOD:22306}  Type of Therapy: {CHL AMB BH Type of Therapy:21022741}  Treatment Goals addressed: {CHL AMB BH Treatment Goals Addressed:21022754}  Interventions: {CHL AMB BH Type of Intervention:21022753}  Summary: Catherine Joyce is a 31 y.o. female who presents with ***.   Suicidal/Homicidal: {BHH YES OR NO:22294}{yes/no/with/without intent/plan:22693}  Therapist Response: ***  Plan: Return again in *** weeks.  Diagnosis: Axis I: {psych axis 1:31909}    Axis II: {psych axis 2:31910}    Catherine Joyce, LCAS 04/10/2016

## 2016-04-11 ENCOUNTER — Other Ambulatory Visit (HOSPITAL_COMMUNITY): Payer: BLUE CROSS/BLUE SHIELD

## 2016-04-11 ENCOUNTER — Other Ambulatory Visit (HOSPITAL_COMMUNITY): Payer: Self-pay | Admitting: Medical

## 2016-04-11 DIAGNOSIS — F411 Generalized anxiety disorder: Secondary | ICD-10-CM

## 2016-04-11 DIAGNOSIS — F3289 Other specified depressive episodes: Secondary | ICD-10-CM

## 2016-04-11 MED ORDER — CLONIDINE HCL 0.2 MG PO TABS: 0.2000 mg | ORAL_TABLET | Freq: Every day | ORAL | 2 refills | Status: DC

## 2016-04-11 MED ORDER — GABAPENTIN 300 MG PO CAPS: 300.0000 mg | ORAL_CAPSULE | Freq: Two times a day (BID) | ORAL | 2 refills | Status: DC

## 2016-04-11 MED ORDER — BUPROPION HCL ER (XL) 300 MG PO TB24: 300.0000 mg | ORAL_TABLET | Freq: Every day | ORAL | 2 refills | Status: DC

## 2016-04-11 MED ORDER — NALTREXONE HCL 50 MG PO TABS: 50.0000 mg | ORAL_TABLET | Freq: Every day | ORAL | 2 refills | Status: DC

## 2016-04-11 MED ORDER — PROPRANOLOL HCL 10 MG PO TABS: ORAL_TABLET | ORAL | 2 refills | Status: DC

## 2016-04-12 ENCOUNTER — Other Ambulatory Visit (HOSPITAL_COMMUNITY): Payer: BLUE CROSS/BLUE SHIELD

## 2016-04-16 ENCOUNTER — Other Ambulatory Visit (HOSPITAL_COMMUNITY): Payer: BLUE CROSS/BLUE SHIELD

## 2016-04-18 ENCOUNTER — Other Ambulatory Visit (HOSPITAL_COMMUNITY): Payer: Self-pay | Admitting: Medical

## 2016-04-18 ENCOUNTER — Other Ambulatory Visit (HOSPITAL_COMMUNITY): Payer: BLUE CROSS/BLUE SHIELD

## 2016-04-18 MED ORDER — SERTRALINE HCL 100 MG PO TABS: 100.0000 mg | ORAL_TABLET | Freq: Every day | ORAL | 1 refills | Status: DC

## 2016-04-18 MED ORDER — TRAZODONE HCL 50 MG PO TABS: 50.0000 mg | ORAL_TABLET | Freq: Every evening | ORAL | 1 refills | Status: DC | PRN

## 2016-04-18 NOTE — Progress Notes (Signed)
Pt requesting meds Zoloft and Trazodone-filled as courtesy-must see Dr Manson PasseyBrown for ant further meds

## 2016-04-19 ENCOUNTER — Other Ambulatory Visit (HOSPITAL_COMMUNITY): Payer: BLUE CROSS/BLUE SHIELD

## 2016-04-23 ENCOUNTER — Other Ambulatory Visit (HOSPITAL_COMMUNITY): Payer: BLUE CROSS/BLUE SHIELD

## 2016-04-25 ENCOUNTER — Other Ambulatory Visit (HOSPITAL_COMMUNITY): Payer: Self-pay | Admitting: Medical

## 2016-04-25 ENCOUNTER — Telehealth (HOSPITAL_COMMUNITY): Payer: Self-pay | Admitting: Medical

## 2016-04-25 ENCOUNTER — Other Ambulatory Visit (HOSPITAL_COMMUNITY): Payer: BLUE CROSS/BLUE SHIELD

## 2016-04-25 LAB — CBC WITH DIFFERENTIAL/PLATELET
BASOS ABS: 96 {cells}/uL (ref 0–200)
Basophils Relative: 1 %
EOS PCT: 5 %
Eosinophils Absolute: 480 cells/uL (ref 15–500)
HCT: 41.8 % (ref 35.0–45.0)
Hemoglobin: 13.7 g/dL (ref 11.7–15.5)
Lymphocytes Relative: 29 %
Lymphs Abs: 2784 cells/uL (ref 850–3900)
MCH: 31.1 pg (ref 27.0–33.0)
MCHC: 32.8 g/dL (ref 32.0–36.0)
MCV: 95 fL (ref 80.0–100.0)
MONOS PCT: 6 %
MPV: 8.9 fL (ref 7.5–12.5)
Monocytes Absolute: 576 cells/uL (ref 200–950)
NEUTROS ABS: 5664 {cells}/uL (ref 1500–7800)
Neutrophils Relative %: 59 %
PLATELETS: 359 10*3/uL (ref 140–400)
RBC: 4.4 MIL/uL (ref 3.80–5.10)
RDW: 12.4 % (ref 11.0–15.0)
WBC: 9.6 10*3/uL (ref 3.8–10.8)

## 2016-04-26 ENCOUNTER — Encounter (HOSPITAL_COMMUNITY): Payer: Self-pay | Admitting: Medical

## 2016-04-26 ENCOUNTER — Other Ambulatory Visit (HOSPITAL_COMMUNITY): Payer: BLUE CROSS/BLUE SHIELD

## 2016-04-26 DIAGNOSIS — R748 Abnormal levels of other serum enzymes: Secondary | ICD-10-CM | POA: Insufficient documentation

## 2016-04-26 LAB — HEPATIC FUNCTION PANEL
ALK PHOS: 44 U/L (ref 33–115)
ALT: 16 U/L (ref 6–29)
AST: 35 U/L — ABNORMAL HIGH (ref 10–30)
Albumin: 4.5 g/dL (ref 3.6–5.1)
BILIRUBIN DIRECT: 0.1 mg/dL (ref <=0.2)
Indirect Bilirubin: 0.3 mg/dL (ref 0.2–1.2)
Total Bilirubin: 0.4 mg/dL (ref 0.2–1.2)
Total Protein: 7.5 g/dL (ref 6.1–8.1)

## 2016-04-26 LAB — BASIC METABOLIC PANEL
BUN: 14 mg/dL (ref 7–25)
CALCIUM: 9.8 mg/dL (ref 8.6–10.2)
CO2: 27 mmol/L (ref 20–31)
Chloride: 101 mmol/L (ref 98–110)
Creat: 1.17 mg/dL — ABNORMAL HIGH (ref 0.50–1.10)
Glucose, Bld: 85 mg/dL (ref 65–99)
Potassium: 4.6 mmol/L (ref 3.5–5.3)
SODIUM: 137 mmol/L (ref 135–146)

## 2016-04-26 LAB — T4, FREE: Free T4: 1 ng/dL (ref 0.8–1.8)

## 2016-04-26 LAB — HEPATITIS PANEL, ACUTE
HCV AB: NEGATIVE
Hep A IgM: NONREACTIVE
Hep B C IgM: NONREACTIVE
Hepatitis B Surface Ag: NEGATIVE

## 2016-04-26 LAB — TSH: TSH: 1.72 mIU/L

## 2016-04-26 LAB — T3, FREE: T3 FREE: 3 pg/mL (ref 2.3–4.2)

## 2016-04-30 ENCOUNTER — Other Ambulatory Visit (HOSPITAL_COMMUNITY): Payer: BLUE CROSS/BLUE SHIELD

## 2016-04-30 ENCOUNTER — Telehealth (HOSPITAL_COMMUNITY): Payer: Self-pay | Admitting: Medical

## 2016-04-30 MED ORDER — TRAZODONE HCL 100 MG PO TABS: ORAL_TABLET | ORAL | 1 refills | Status: DC

## 2016-04-30 MED ORDER — GABAPENTIN 300 MG PO CAPS: ORAL_CAPSULE | ORAL | 1 refills | Status: DC

## 2016-05-02 ENCOUNTER — Other Ambulatory Visit (HOSPITAL_COMMUNITY): Payer: BLUE CROSS/BLUE SHIELD

## 2016-05-03 ENCOUNTER — Other Ambulatory Visit (HOSPITAL_COMMUNITY): Payer: BLUE CROSS/BLUE SHIELD

## 2016-05-06 NOTE — Telephone Encounter (Signed)
Done

## 2016-05-11 ENCOUNTER — Ambulatory Visit (HOSPITAL_COMMUNITY): Payer: BLUE CROSS/BLUE SHIELD | Admitting: Psychology

## 2016-06-11 ENCOUNTER — Other Ambulatory Visit (HOSPITAL_COMMUNITY): Payer: Self-pay | Admitting: Medical

## 2016-06-26 ENCOUNTER — Other Ambulatory Visit (HOSPITAL_COMMUNITY): Payer: Self-pay | Admitting: Medical

## 2016-07-09 ENCOUNTER — Other Ambulatory Visit (HOSPITAL_COMMUNITY): Payer: Self-pay | Admitting: Medical

## 2016-07-09 MED ORDER — PROPRANOLOL HCL 10 MG PO TABS: ORAL_TABLET | ORAL | 0 refills | Status: DC

## 2016-07-09 MED ORDER — GABAPENTIN 300 MG PO CAPS: ORAL_CAPSULE | ORAL | 0 refills | Status: DC

## 2016-07-09 MED ORDER — NALTREXONE HCL 50 MG PO TABS: 50.0000 mg | ORAL_TABLET | Freq: Every day | ORAL | 0 refills | Status: DC

## 2016-07-09 NOTE — Progress Notes (Signed)
Pt requesting refills until Appt with DR Manson PasseyBrown given 28 day supply for July 7 appt NO MORE REFILLS AFTER THIS

## 2016-08-08 ENCOUNTER — Encounter (HOSPITAL_COMMUNITY): Payer: Self-pay | Admitting: Emergency Medicine

## 2016-08-08 ENCOUNTER — Emergency Department (HOSPITAL_COMMUNITY)
Admission: EM | Admit: 2016-08-08 | Discharge: 2016-08-08 | Disposition: A | Discharge status: Home / Self Care | Payer: Self-pay | Attending: Emergency Medicine | Admitting: Emergency Medicine

## 2016-08-08 ENCOUNTER — Emergency Department (HOSPITAL_COMMUNITY): Payer: Self-pay

## 2016-08-08 DIAGNOSIS — F17210 Nicotine dependence, cigarettes, uncomplicated: Secondary | ICD-10-CM | POA: Insufficient documentation

## 2016-08-08 DIAGNOSIS — Z79899 Other long term (current) drug therapy: Secondary | ICD-10-CM | POA: Insufficient documentation

## 2016-08-08 DIAGNOSIS — F410 Panic disorder [episodic paroxysmal anxiety] without agoraphobia: Secondary | ICD-10-CM | POA: Insufficient documentation

## 2016-08-08 HISTORY — DX: Major depressive disorder, single episode, unspecified: F32.9

## 2016-08-08 HISTORY — DX: Migraine, unspecified, not intractable, without status migrainosus: G43.909

## 2016-08-08 HISTORY — DX: Depression, unspecified: F32.A

## 2016-08-08 HISTORY — DX: Other psychoactive substance dependence, uncomplicated: F19.20

## 2016-08-08 LAB — CBC WITH DIFFERENTIAL/PLATELET
BASOS ABS: 0.1 10*3/uL (ref 0.0–0.1)
BASOS PCT: 1 %
EOS ABS: 0.3 10*3/uL (ref 0.0–0.7)
Eosinophils Relative: 4 %
HEMATOCRIT: 44.3 % (ref 36.0–46.0)
HEMOGLOBIN: 14.1 g/dL (ref 12.0–15.0)
Lymphocytes Relative: 20 %
Lymphs Abs: 1.6 10*3/uL (ref 0.7–4.0)
MCH: 30.3 pg (ref 26.0–34.0)
MCHC: 31.8 g/dL (ref 30.0–36.0)
MCV: 95.1 fL (ref 78.0–100.0)
MONO ABS: 0.3 10*3/uL (ref 0.1–1.0)
Monocytes Relative: 4 %
NEUTROS ABS: 5.9 10*3/uL (ref 1.7–7.7)
Neutrophils Relative %: 71 %
Platelets: 346 10*3/uL (ref 150–400)
RBC: 4.66 MIL/uL (ref 3.87–5.11)
RDW: 13 % (ref 11.5–15.5)
WBC: 8.2 10*3/uL (ref 4.0–10.5)

## 2016-08-08 LAB — COMPREHENSIVE METABOLIC PANEL
ALT: 12 U/L — AB (ref 14–54)
AST: 39 U/L (ref 15–41)
Albumin: 4.7 g/dL (ref 3.5–5.0)
Alkaline Phosphatase: 38 U/L (ref 38–126)
Anion gap: 9 (ref 5–15)
BUN: 10 mg/dL (ref 6–20)
CHLORIDE: 102 mmol/L (ref 101–111)
CO2: 25 mmol/L (ref 22–32)
CREATININE: 0.87 mg/dL (ref 0.44–1.00)
Calcium: 9.6 mg/dL (ref 8.9–10.3)
Glucose, Bld: 106 mg/dL — ABNORMAL HIGH (ref 65–99)
POTASSIUM: 4.2 mmol/L (ref 3.5–5.1)
SODIUM: 136 mmol/L (ref 135–145)
Total Bilirubin: 0.8 mg/dL (ref 0.3–1.2)
Total Protein: 7.2 g/dL (ref 6.5–8.1)

## 2016-08-08 LAB — I-STAT TROPONIN, ED: TROPONIN I, POC: 0 ng/mL (ref 0.00–0.08)

## 2016-08-08 LAB — POC URINE PREG, ED: Preg Test, Ur: NEGATIVE

## 2016-08-08 MED ORDER — HYDROXYZINE HCL 25 MG PO TABS
25.0000 mg | ORAL_TABLET | Freq: Once | ORAL | Status: AC
  Administered 2016-08-08: 25 mg via ORAL
  Filled 2016-08-08: qty 1

## 2016-08-08 MED ORDER — HYDROXYZINE HCL 25 MG PO TABS: 25.0000 mg | ORAL_TABLET | Freq: Once | ORAL | Status: DC
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
## 2016-08-08 NOTE — ED Notes (Signed)
MD at bedside updating pt

## 2016-08-08 NOTE — ED Notes (Signed)
Patient able to ambulate independently  

## 2016-08-08 NOTE — ED Triage Notes (Signed)
PT WAS VACUMING AND STARTED TO HAVE THE SHAKES NO LOC SHE STATES , SHE WAS TREMBLING SO HARD, CALLED HER HUSBAND AND WAS LAYING ON FLOOR WHEN EMS ARRIBED AND WAS ABLE TO GET UP AND  OPEN THE DOOR. pts heart rate was 160 upon ems arrival,   pt is 8 months clean drug addict and she feels it =may be from a change in her welbutrin dose, it was lowered a few days ago to 400

## 2016-10-09 ENCOUNTER — Encounter (HOSPITAL_COMMUNITY): Payer: Self-pay | Admitting: Psychology

## 2017-05-30 DIAGNOSIS — K644 Residual hemorrhoidal skin tags: Secondary | ICD-10-CM | POA: Diagnosis not present

## 2017-05-30 DIAGNOSIS — R197 Diarrhea, unspecified: Secondary | ICD-10-CM | POA: Diagnosis not present

## 2017-06-16 DIAGNOSIS — J069 Acute upper respiratory infection, unspecified: Secondary | ICD-10-CM | POA: Diagnosis not present

## 2017-06-18 ENCOUNTER — Encounter (HOSPITAL_COMMUNITY): Payer: Self-pay | Admitting: Emergency Medicine

## 2017-06-18 ENCOUNTER — Other Ambulatory Visit: Payer: Self-pay

## 2017-06-18 ENCOUNTER — Inpatient Hospital Stay (HOSPITAL_COMMUNITY)
Admission: AD | Admit: 2017-06-18 | Discharge: 2017-06-23 | DRG: 885 | Disposition: A | Discharge status: Home / Self Care | Payer: 59 | Source: Intra-hospital | Attending: Psychiatry | Admitting: Psychiatry

## 2017-06-18 ENCOUNTER — Encounter (HOSPITAL_COMMUNITY): Payer: Self-pay

## 2017-06-18 ENCOUNTER — Emergency Department (HOSPITAL_COMMUNITY)
Admission: EM | Admit: 2017-06-18 | Discharge: 2017-06-18 | Disposition: A | Discharge status: Other Acute Inpatient Hospital | Payer: 59 | Attending: Emergency Medicine | Admitting: Emergency Medicine

## 2017-06-18 DIAGNOSIS — Z79899 Other long term (current) drug therapy: Secondary | ICD-10-CM

## 2017-06-18 DIAGNOSIS — F319 Bipolar disorder, unspecified: Secondary | ICD-10-CM | POA: Diagnosis present

## 2017-06-18 DIAGNOSIS — F17210 Nicotine dependence, cigarettes, uncomplicated: Secondary | ICD-10-CM | POA: Insufficient documentation

## 2017-06-18 DIAGNOSIS — F314 Bipolar disorder, current episode depressed, severe, without psychotic features: Secondary | ICD-10-CM | POA: Diagnosis present

## 2017-06-18 DIAGNOSIS — F332 Major depressive disorder, recurrent severe without psychotic features: Secondary | ICD-10-CM | POA: Insufficient documentation

## 2017-06-18 DIAGNOSIS — Z008 Encounter for other general examination: Secondary | ICD-10-CM | POA: Insufficient documentation

## 2017-06-18 DIAGNOSIS — R45851 Suicidal ideations: Secondary | ICD-10-CM | POA: Diagnosis not present

## 2017-06-18 DIAGNOSIS — F329 Major depressive disorder, single episode, unspecified: Secondary | ICD-10-CM | POA: Diagnosis present

## 2017-06-18 LAB — ETHANOL: Alcohol, Ethyl (B): <10 mg/dL (ref <10)

## 2017-06-18 LAB — CBC WITH DIFFERENTIAL/PLATELET
BASOS ABS: 0 10*3/uL (ref 0.0–0.1)
Basophils Relative: 0 %
EOS ABS: 0.4 10*3/uL (ref 0.0–0.7)
EOS PCT: 4 %
HCT: 43.2 % (ref 36.0–46.0)
Hemoglobin: 14 g/dL (ref 12.0–15.0)
LYMPHS PCT: 18 %
Lymphs Abs: 1.9 10*3/uL (ref 0.7–4.0)
MCH: 30.6 pg (ref 26.0–34.0)
MCHC: 32.4 g/dL (ref 30.0–36.0)
MCV: 94.5 fL (ref 78.0–100.0)
Monocytes Absolute: 0.5 10*3/uL (ref 0.1–1.0)
Monocytes Relative: 5 %
Neutro Abs: 8 10*3/uL — ABNORMAL HIGH (ref 1.7–7.7)
Neutrophils Relative %: 73 %
PLATELETS: 559 10*3/uL — AB (ref 150–400)
RBC: 4.57 MIL/uL (ref 3.87–5.11)
RDW: 13.1 % (ref 11.5–15.5)
WBC: 10.9 10*3/uL — ABNORMAL HIGH (ref 4.0–10.5)

## 2017-06-18 LAB — COMPREHENSIVE METABOLIC PANEL
ALBUMIN: 4.4 g/dL (ref 3.5–5.0)
ALK PHOS: 73 U/L (ref 38–126)
ALT: 12 U/L — AB (ref 14–54)
AST: 34 U/L (ref 15–41)
Anion gap: 10 (ref 5–15)
BILIRUBIN TOTAL: 0.4 mg/dL (ref 0.3–1.2)
BUN: 7 mg/dL (ref 6–20)
CO2: 26 mmol/L (ref 22–32)
Calcium: 9.5 mg/dL (ref 8.9–10.3)
Chloride: 105 mmol/L (ref 101–111)
Creatinine, Ser: 0.74 mg/dL (ref 0.44–1.00)
GFR calc Af Amer: >60 mL/min (ref >60)
GFR calc non Af Amer: >60 mL/min (ref >60)
Glucose, Bld: 94 mg/dL (ref 65–99)
POTASSIUM: 4.4 mmol/L (ref 3.5–5.1)
Sodium: 141 mmol/L (ref 135–145)
TOTAL PROTEIN: 7.9 g/dL (ref 6.5–8.1)

## 2017-06-18 LAB — I-STAT BETA HCG BLOOD, ED (MC, WL, AP ONLY)

## 2017-06-18 LAB — RAPID URINE DRUG SCREEN, HOSP PERFORMED
Amphetamines: NOT DETECTED
BENZODIAZEPINES: NOT DETECTED
Barbiturates: NOT DETECTED
Cocaine: NOT DETECTED
Opiates: NOT DETECTED
Tetrahydrocannabinol: NOT DETECTED

## 2017-06-18 MED ORDER — TRAZODONE HCL 100 MG PO TABS
100.0000 mg | ORAL_TABLET | Freq: Every day | ORAL | Status: DC
  Filled 2017-06-18: qty 1

## 2017-06-18 MED ORDER — OXCARBAZEPINE 300 MG PO TABS
600.0000 mg | ORAL_TABLET | Freq: Every day | ORAL | Status: DC
  Administered 2017-06-18: 600 mg via ORAL
  Filled 2017-06-18 (×3): qty 2

## 2017-06-18 MED ORDER — TRAZODONE HCL 100 MG PO TABS: 100.0000 mg | ORAL_TABLET | Freq: Every day | ORAL | Status: DC

## 2017-06-18 MED ORDER — PROPRANOLOL HCL 10 MG PO TABS: 10.0000 mg | ORAL_TABLET | Freq: Two times a day (BID) | ORAL | Status: DC | PRN

## 2017-06-18 MED ORDER — QUETIAPINE FUMARATE 50 MG PO TABS: 50.0000 mg | ORAL_TABLET | Freq: Every day | ORAL | Status: DC

## 2017-06-18 MED ORDER — PROPRANOLOL HCL 10 MG PO TABS
10.0000 mg | ORAL_TABLET | Freq: Two times a day (BID) | ORAL | Status: DC | PRN
  Filled 2017-06-18: qty 1

## 2017-06-18 MED ORDER — SERTRALINE HCL 100 MG PO TABS
100.0000 mg | ORAL_TABLET | Freq: Every day | ORAL | Status: DC
Start: 2017-06-19 — End: 2017-06-18
  Filled 2017-06-18: qty 1

## 2017-06-18 MED ORDER — HYDROXYZINE HCL 25 MG PO TABS
25.0000 mg | ORAL_TABLET | Freq: Four times a day (QID) | ORAL | Status: DC | PRN
  Administered 2017-06-19 (×2): 25 mg via ORAL
  Filled 2017-06-18 (×3): qty 1

## 2017-06-18 MED ORDER — LAMOTRIGINE 200 MG PO TABS
200.0000 mg | ORAL_TABLET | Freq: Every day | ORAL | Status: DC
  Administered 2017-06-18 – 2017-06-22 (×5): 200 mg via ORAL
  Filled 2017-06-18 (×4): qty 1
  Filled 2017-06-18: qty 2
  Filled 2017-06-18 (×2): qty 1

## 2017-06-18 MED ORDER — BUPROPION HCL ER (XL) 300 MG PO TB24
300.0000 mg | ORAL_TABLET | Freq: Every day | ORAL | Status: DC
  Filled 2017-06-18: qty 1

## 2017-06-18 MED ORDER — NICOTINE 21 MG/24HR TD PT24
21.0000 mg | MEDICATED_PATCH | Freq: Every day | TRANSDERMAL | Status: DC
  Administered 2017-06-19 – 2017-06-23 (×5): 21 mg via TRANSDERMAL
  Filled 2017-06-18 (×7): qty 1

## 2017-06-18 MED ORDER — BUPROPION HCL ER (XL) 150 MG PO TB24
300.0000 mg | ORAL_TABLET | Freq: Every day | ORAL | Status: DC
  Filled 2017-06-18: qty 2

## 2017-06-18 MED ORDER — VENLAFAXINE HCL ER 75 MG PO CP24
75.0000 mg | ORAL_CAPSULE | Freq: Every day | ORAL | Status: DC
  Administered 2017-06-19 – 2017-06-20 (×2): 75 mg via ORAL
  Filled 2017-06-18 (×3): qty 1

## 2017-06-18 MED ORDER — QUETIAPINE FUMARATE 50 MG PO TABS
50.0000 mg | ORAL_TABLET | Freq: Every day | ORAL | Status: DC
  Filled 2017-06-18: qty 1

## 2017-06-18 MED ORDER — SPIRONOLACTONE 25 MG PO TABS
50.0000 mg | ORAL_TABLET | Freq: Every day | ORAL | Status: DC
  Administered 2017-06-19 – 2017-06-23 (×5): 50 mg via ORAL
  Filled 2017-06-18 (×3): qty 1
  Filled 2017-06-18: qty 2
  Filled 2017-06-18 (×3): qty 1
  Filled 2017-06-18: qty 2

## 2017-06-18 MED ORDER — CLONIDINE HCL 0.2 MG PO TABS
0.2000 mg | ORAL_TABLET | Freq: Every day | ORAL | Status: DC
  Filled 2017-06-18: qty 1

## 2017-06-18 MED ORDER — SERTRALINE HCL 50 MG PO TABS
100.0000 mg | ORAL_TABLET | Freq: Every day | ORAL | Status: DC
  Filled 2017-06-18: qty 2

## 2017-06-18 MED ORDER — NALTREXONE HCL 50 MG PO TABS
50.0000 mg | ORAL_TABLET | Freq: Every day | ORAL | Status: DC
  Filled 2017-06-18: qty 1

## 2017-06-18 MED ORDER — CLONIDINE HCL 0.1 MG PO TABS: 0.2000 mg | ORAL_TABLET | Freq: Every day | ORAL | Status: DC

## 2017-06-18 MED ORDER — LURASIDONE HCL 20 MG PO TABS
20.0000 mg | ORAL_TABLET | Freq: Every day | ORAL | Status: DC
  Administered 2017-06-18: 20 mg via ORAL
  Filled 2017-06-18 (×3): qty 1

## 2017-06-18 MED ORDER — HYDROXYZINE HCL 50 MG PO TABS
50.0000 mg | ORAL_TABLET | Freq: Every day | ORAL | Status: DC
  Administered 2017-06-18 – 2017-06-22 (×5): 50 mg via ORAL
  Filled 2017-06-18 (×7): qty 1

## 2017-06-18 MED ORDER — LAMOTRIGINE 100 MG PO TABS
100.0000 mg | ORAL_TABLET | Freq: Every day | ORAL | Status: DC
  Administered 2017-06-19 – 2017-06-23 (×5): 100 mg via ORAL
  Filled 2017-06-18 (×7): qty 1

## 2017-06-18 MED ORDER — GABAPENTIN 100 MG PO CAPS
100.0000 mg | ORAL_CAPSULE | Freq: Three times a day (TID) | ORAL | Status: DC
  Administered 2017-06-18: 100 mg via ORAL
  Filled 2017-06-18 (×6): qty 1

## 2017-06-18 MED ORDER — GABAPENTIN 100 MG PO CAPS: 100.0000 mg | ORAL_CAPSULE | Freq: Three times a day (TID) | ORAL | Status: DC

## 2017-06-18 MED ORDER — MAGNESIUM HYDROXIDE 400 MG/5ML PO SUSP: 30.0000 mL | Freq: Every day | ORAL | Status: DC | PRN

## 2017-06-18 MED ORDER — LAMOTRIGINE 100 MG PO TABS
200.0000 mg | ORAL_TABLET | Freq: Every day | ORAL | Status: DC
  Filled 2017-06-18: qty 2

## 2017-06-18 MED ORDER — LAMOTRIGINE 200 MG PO TABS
200.0000 mg | ORAL_TABLET | Freq: Every day | ORAL | Status: DC
  Filled 2017-06-18: qty 1

## 2017-06-18 MED ORDER — TRAZODONE HCL 50 MG PO TABS
50.0000 mg | ORAL_TABLET | Freq: Every evening | ORAL | Status: DC | PRN
  Administered 2017-06-18 – 2017-06-20 (×3): 50 mg via ORAL
  Filled 2017-06-18 (×2): qty 1

## 2017-06-18 MED ORDER — ACETAMINOPHEN 325 MG PO TABS: 650.0000 mg | ORAL_TABLET | Freq: Four times a day (QID) | ORAL | Status: DC | PRN

## 2017-06-18 MED ORDER — ALUM & MAG HYDROXIDE-SIMETH 200-200-20 MG/5ML PO SUSP: 30.0000 mL | ORAL | Status: DC | PRN

## 2017-06-18 NOTE — Progress Notes (Signed)
T.C. to [facility], [address] to verify current medication list.  Dewayne Hatch, pharmacy staff, reports that Catherine Joyce is taking Hydroxyzine 25 mg prn, Effexor 37.5mg  bid, Trileptal  hs, Spironolactone  qd, Lamictal  1 1/2 tab daily, Propranolol  qd and new Rx of Nuvigil that has not been picked up.  Notified Dr. Jola Babinski of the above and no order changes needed at this time.

## 2017-06-18 NOTE — ED Notes (Signed)
Pt Belongings: Tan wallet, debit [phone], check book, misc. Amount of change, $1.00 dollar bill, [place] [address], 1 set of keyes, vape pen, sunglasses, iphone with teal case, gray pants, plaid blue shirt, tan bra, tan-gray shoes, socks, burgundy purse, 2 small pouches/makeup bags in purse. Belongings secured in locker #34. Belongings itemized on belongings sheet located in pt chart. Pt signed belongings sheet, acknowledging the understanding that belongings would remain locked up until discharge.
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
## 2017-06-18 NOTE — ED Notes (Signed)
Pt admitted to room #34. Pt behavior cooperative, pleasant on approach. Pt endorsing increase in depression. Pt reports seeing psychiatrist yesterday who made medication change. Pt endorsing SI without plan. Denies AVH. Pt reports "constant intrusive thoughts of suicide." Encouragement and support provided. Special checks q 15 mins in place for safety. Will continue to monitor.

## 2017-06-18 NOTE — ED Triage Notes (Signed)
Patient here from home with complaints of increased depression. Hx of same. States that she has been having thoughts of hurting self. Brought in by mom. Saw psychiatrists yesterday, changed meds with no relief.

## 2017-06-18 NOTE — ED Notes (Signed)
Pt has been scrubed out and wanded by security, pt's belongings are located at nurses desk.

## 2017-06-18 NOTE — ED Notes (Signed)
[facility] on unit to transfer pt to [facility] Adult unit per MD order. Personal property given to [facility] transport for transfer. Pt signed e-signature. Ambulatory off unit with [facility].

## 2017-06-18 NOTE — Tx Team (Signed)
Initial Treatment Plan 06/18/2017 5:36 PM Catherine Joyce ZOX:[id number]    PATIENT STRESSORS: Marital or family conflict Medication change or noncompliance Occupational concerns   PATIENT STRENGTHS: Ability for insight Active sense of humor Average or above average intelligence Capable of independent living Communication skills Financial means General fund of knowledge Motivation for treatment/growth   PATIENT IDENTIFIED PROBLEMS: "I don't want to think about dying anymore"  "I just want to be happy and functional"                   DISCHARGE CRITERIA:  Ability to meet basic life and health needs Adequate post-discharge living arrangements Improved stabilization in mood, thinking, and/or behavior Medical problems require only outpatient monitoring Motivation to continue treatment in a less acute level of care Need for constant or close observation no longer present Reduction of life-threatening or endangering symptoms to within safe limits Safe-care adequate arrangements made Verbal commitment to aftercare and medication compliance  PRELIMINARY DISCHARGE PLAN: Outpatient therapy  PATIENT/FAMILY INVOLVEMENT: This treatment plan has been presented to and reviewed with the patient, Catherine Joyce.  The patient and family have been given the opportunity to ask questions and make suggestions.  Ferrel Logan, RN 06/18/2017, 5:36 PM

## 2017-06-18 NOTE — BH Assessment (Signed)
[facility] Assessment Progress Note  Per Laveda Abbe, FNP, this pt requires psychiatric hospitalization at this time.  Berneice Heinrich, RN, [facility] has assigned pt to [facility] Rm 406-2.  Pt has signed Voluntary Admission and Consent for Treatment, as well as Consent to Release Information to her husband and to [facility], and a notification call has been placed to the provider.  Signed forms have been faxed to [employer].  Pt's nurse, Morrie Sheldon, has been notified, and agrees to send original paperwork along with pt via Juel Burrow, and to call report to [phone].  Doylene Canning, [place] [facility] Coordinator [phone]
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
## 2017-06-18 NOTE — ED Notes (Signed)
Bed: [facility] Expected date:  Expected time:  Means of arrival:  Comments: 

## 2017-06-19 DIAGNOSIS — F314 Bipolar disorder, current episode depressed, severe, without psychotic features: Principal | ICD-10-CM

## 2017-06-19 MED ORDER — OXCARBAZEPINE 150 MG PO TABS
450.0000 mg | ORAL_TABLET | Freq: Every day | ORAL | Status: DC
  Administered 2017-06-19: 450 mg via ORAL
  Filled 2017-06-19 (×2): qty 3

## 2017-06-19 NOTE — Progress Notes (Signed)
Patient ID: Catherine Joyce, female   DOB: [birth year], 32 y.o.   MRN: [id number]  DAR Note: Pt observed isolated to her room. Pt appears to be very restless and anxious. Pt endorsed severe anxiety with moderate depression; "my mind keep wondering; I can't think straight." Pt denied SI/HI, AVH or pain. Pt contracts for safety. Medications offered as prescribed. All patient's questions and concerns addressed. Support, encouragement, and safe environment provided. 15-minute safety checks continue. Pt was med compliant. Safety checks continue.

## 2017-06-19 NOTE — Tx Team (Addendum)
Interdisciplinary Treatment and Diagnostic Plan Update  06/19/2017 Time of Session: 5916 Catherine Joyce MRN: [id number]  Principal Diagnosis: Bipolar 1 disorder, depressed, severe ([place])  Secondary Diagnoses: Principal Problem:   Bipolar 1 disorder, depressed, severe ([place])   Current Medications:  Current Facility-Administered Medications  Medication Dose Route Frequency Provider Last Rate Last Dose  . acetaminophen (TYLENOL) tablet 650 mg  650 mg Oral Q6H PRN Ethelene Hal, NP      . alum & mag hydroxide-simeth (MAALOX/MYLANTA) 200-200-20 MG/5ML suspension 30 mL  30 mL Oral Q4H PRN Ethelene Hal, NP      . hydrOXYzine (ATARAX/VISTARIL) tablet 25 mg  25 mg Oral Q6H PRN Sharma Covert, MD   25 mg at 06/19/17 0932  . hydrOXYzine (ATARAX/VISTARIL) tablet 50 mg  50 mg Oral QHS Sharma Covert, MD   50 mg at 06/18/17 2151  . lamoTRIgine (LAMICTAL) tablet 100 mg  100 mg Oral Daily Sharma Covert, MD   100 mg at 06/19/17 0834  . lamoTRIgine (LAMICTAL) tablet 200 mg  200 mg Oral QHS Cobos, Myer Peer, MD   200 mg at 06/18/17 2151  . magnesium hydroxide (MILK OF MAGNESIA) suspension 30 mL  30 mL Oral Daily PRN Ethelene Hal, NP      . nicotine (NICODERM CQ - dosed in mg/24 hours) patch 21 mg  21 mg Transdermal Daily Sharma Covert, MD   21 mg at 06/19/17 0834  . OXcarbazepine (TRILEPTAL) tablet 450 mg  450 mg Oral QHS Sharma Covert, MD      . propranolol (INDERAL) tablet 10 mg  10 mg Oral BID PRN Ethelene Hal, NP      . spironolactone (ALDACTONE) tablet 50 mg  50 mg Oral Daily Sharma Covert, MD   50 mg at 06/19/17 0834  . traZODone (DESYREL) tablet 50 mg  50 mg Oral QHS PRN Sharma Covert, MD   50 mg at 06/18/17 2150  . venlafaxine XR (EFFEXOR-XR) 24 hr capsule 75 mg  75 mg Oral Q breakfast Sharma Covert, MD   75 mg at 06/19/17 5701   PTA Medications: Medications Prior to Admission  Medication Sig Dispense Refill Last Dose  .  buPROPion (WELLBUTRIN XL) 300 MG 24 hr tablet Take 1 tablet (300 mg total) by mouth daily. 30 tablet 2   . cariprazine (VRAYLAR) capsule Take 3 mg by mouth daily.   Past Week at Unknown time  . cloNIDine (CATAPRES) 0.2 MG tablet Take 1 tablet (0.2 mg total) by mouth at bedtime. (Patient not taking: Reported on 06/18/2017) 30 tablet 2 Not Taking at Unknown time  . gabapentin (NEURONTIN) 300 MG capsule Take 1-2 caps TID (Patient not taking: Reported on 06/18/2017) 168 capsule 0 Not Taking at Unknown time  . lamoTRIgine (LAMICTAL) 200 MG tablet Take 1 tablet (200 mg total) by mouth daily. 30 tablet 2 06/18/2017 at Unknown time  . LORazepam (ATIVAN) 0.5 MG tablet Take 0.5 mg by mouth daily.   06/17/2017 at Unknown time  . lurasidone (LATUDA) 20 MG TABS tablet Take 20 mg by mouth daily.   06/17/2017 at Unknown time  . naltrexone (DEPADE) 50 MG tablet Take 1 tablet (50 mg total) by mouth daily. (Patient not taking: Reported on 06/18/2017) 28 tablet 0 Not Taking at Unknown time  . nicotine (NICODERM CQ) 7 mg/24hr patch Place 1 patch (7 mg total) onto the skin daily. (Patient not taking: Reported on 06/18/2017) 28 patch 0 Not Taking at Unknown  time  . propranolol (INDERAL) 10 MG tablet Take for anxiety as directed 28 tablet 0 06/17/2017 at Unknown time  . QUEtiapine (SEROQUEL) 50 MG tablet Take 1 tablet (50 mg total) by mouth at bedtime. 30 tablet 2   . sertraline (ZOLOFT) 100 MG tablet Take 1 tablet (100 mg total) by mouth daily. (Patient not taking: Reported on 06/18/2017) 30 tablet 1 Not Taking at Unknown time  . spironolactone (ALDACTONE) 50 MG tablet Take 50 mg by mouth daily.   06/17/2017 at Unknown time  . traZODone (DESYREL) 100 MG tablet Take 1  To 1&1/2 to 2 tablets HS (Patient not taking: Reported on 06/18/2017) 60 tablet 1 Not Taking at Unknown time  . venlafaxine (EFFEXOR) 75 MG tablet Take 75 mg by mouth daily.   06/18/2017 at Unknown time    Patient Stressors: Marital or family conflict Medication  change or noncompliance Occupational concerns  Patient Strengths: Ability for insight Active sense of humor Average or above average intelligence Capable of independent living Occupational psychologist fund of knowledge Motivation for treatment/growth  Treatment Modalities: Medication Management, Group therapy, Case management,  1 to 1 session with clinician, Psychoeducation, Recreational therapy.   Physician Treatment Plan for Primary Diagnosis: Bipolar 1 disorder, depressed, severe ([place]) Long Term Goal(s): Improvement in symptoms so as ready for discharge Improvement in symptoms so as ready for discharge   Short Term Goals: Ability to identify changes in lifestyle to reduce recurrence of condition will improve Ability to verbalize feelings will improve Ability to disclose and discuss suicidal ideas Ability to demonstrate self-control will improve Ability to identify and develop effective coping behaviors will improve Ability to maintain clinical measurements within normal limits will improve Compliance with prescribed medications will improve Ability to identify changes in lifestyle to reduce recurrence of condition will improve Ability to verbalize feelings will improve Ability to disclose and discuss suicidal ideas Ability to demonstrate self-control will improve Ability to identify and develop effective coping behaviors will improve Ability to maintain clinical measurements within normal limits will improve Compliance with prescribed medications will improve  Medication Management: Evaluate patient's response, side effects, and tolerance of medication regimen.  Therapeutic Interventions: 1 to 1 sessions, Unit Group sessions and Medication administration.  Evaluation of Outcomes: Not Met  Physician Treatment Plan for Secondary Diagnosis: Principal Problem:   Bipolar 1 disorder, depressed, severe ([place])  Long Term Goal(s): Improvement in symptoms so as  ready for discharge Improvement in symptoms so as ready for discharge   Short Term Goals: Ability to identify changes in lifestyle to reduce recurrence of condition will improve Ability to verbalize feelings will improve Ability to disclose and discuss suicidal ideas Ability to demonstrate self-control will improve Ability to identify and develop effective coping behaviors will improve Ability to maintain clinical measurements within normal limits will improve Compliance with prescribed medications will improve Ability to identify changes in lifestyle to reduce recurrence of condition will improve Ability to verbalize feelings will improve Ability to disclose and discuss suicidal ideas Ability to demonstrate self-control will improve Ability to identify and develop effective coping behaviors will improve Ability to maintain clinical measurements within normal limits will improve Compliance with prescribed medications will improve     Medication Management: Evaluate patient's response, side effects, and tolerance of medication regimen.  Therapeutic Interventions: 1 to 1 sessions, Unit Group sessions and Medication administration.  Evaluation of Outcomes: Not Met   RN Treatment Plan for Primary Diagnosis: Bipolar 1 disorder, depressed, severe ([place]) Long Term  Goal(s): Knowledge of disease and therapeutic regimen to maintain health will improve  Short Term Goals: Ability to identify and develop effective coping behaviors will improve and Compliance with prescribed medications will improve  Medication Management: RN will administer medications as ordered by provider, will assess and evaluate patient's response and provide education to patient for prescribed medication. RN will report any adverse and/or side effects to prescribing provider.  Therapeutic Interventions: 1 on 1 counseling sessions, Psychoeducation, Medication administration, Evaluate responses to treatment, Monitor vital signs  and CBGs as ordered, Perform/monitor CIWA, COWS, AIMS and Fall Risk screenings as ordered, Perform wound care treatments as ordered.  Evaluation of Outcomes: Not Met   LCSW Treatment Plan for Primary Diagnosis: Bipolar 1 disorder, depressed, severe ([place]) Long Term Goal(s): Safe transition to appropriate next level of care at discharge, Engage patient in therapeutic group addressing interpersonal concerns.  Short Term Goals: Engage patient in aftercare planning with referrals and resources, Increase social support and Increase skills for wellness and recovery  Therapeutic Interventions: Assess for all discharge needs, 1 to 1 time with Social worker, Explore available resources and support systems, Assess for adequacy in community support network, Educate family and significant other(s) on suicide prevention, Complete Psychosocial Assessment, Interpersonal group therapy.  Evaluation of Outcomes: Not Met   Progress in Treatment: Attending groups: No. Participating in groups: No. Taking medication as prescribed: Yes. Toleration medication: Yes. Family/Significant other contact made: No, will contact:  when given permission Patient understands diagnosis: Yes. Discussing patient identified problems/goals with staff: Yes. Medical problems stabilized or resolved: Yes. Denies suicidal/homicidal ideation: Yes. Issues/concerns per patient self-inventory: No. Other: none  New problem(s) identified: No, Describe:  none  New Short Term/Long Term Goal(s):Pt goal: be stabilized on meds and recover a "will to live."   Discharge Plan or Barriers:   Reason for Continuation of Hospitalization: Depression Medication stabilization  Estimated Length of Stay: 3-5 days. Attendees: Patient:Catherine Joyce 06/19/2017   Physician: Dr Mallie Darting, MD 06/19/2017   Nursing: Megan Mans, RN 06/19/2017   RN Care Manager: 06/19/2017   Social Worker: Lurline Idol, LCSW 06/19/2017   Recreational Therapist:  06/19/2017    Other:  06/19/2017   Other:  06/19/2017  Other: 06/19/2017         Scribe for Treatment Team: Joanne Chars, [place] 06/19/2017 3:22 PM

## 2017-06-19 NOTE — Plan of Care (Signed)
Problem: Safety: Goal: Periods of time without injury will increase Intervention: Patient contracts for safety on the unit. Low fall risk precautions in place. Safety monitored with q15 minute checks. Outcome: Patient remains safe on the unit at this time. 06/19/2017 2:52 PM - Progressing by Ferrel Logan, RN

## 2017-06-19 NOTE — [facility] Group Notes (Signed)
Adult Psychoeducational Group Note  Date:  06/19/2017 Time:  4:00 PM  Group Topic/Focus: Personal Development Self Care:   The focus of this group is to help patients understand the importance of self-care in order to improve or restore emotional, physical, spiritual, interpersonal, and financial health.  Participation Level:  Did Not Attend  Participation Quality:    Affect:    Cognitive:    Insight:   Engagement in Group:    Modes of Intervention:    Additional Comments:  Patient was invited but did not attend group.  Payten Hobin A Marcile Fuquay 06/19/2017, 5:34 PM 
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
## 2017-06-19 NOTE — H&P (Addendum)
Psychiatric Admission Assessment Adult  Patient Identification: Catherine Joyce MRN:  [id number] Date of Evaluation:  06/19/2017 Chief Complaint:  MDD Principal Diagnosis: Bipolar 1 disorder, depressed, severe ([place]) Diagnosis:   Patient Active Problem List   Diagnosis Date Noted  . Bipolar 1 disorder, depressed, severe ([place]) [F31.4] 06/18/2017    Priority: High  . Elevated liver enzymes [R74.8] 04/26/2016  . GAD (generalized anxiety disorder) [F41.1] 01/25/2016  . VULVITIS [N76.0] 06/21/2009  . OTH D/O MENSTRUATION&OTH ABN BLEED FE GNT TRACT [N94.9] 06/21/2009  . NAUSEA AND VOMITING [R11.2] 04/11/2009  . ABDOMINAL PAIN RIGHT LOWER QUADRANT [R10.31] 04/11/2009  . LARYNGOTRACHEOBRONCHITIS, ACUTE [J05.0] 02/17/2009  . ADVERSE REACTION TO MEDICATION [T50.995A] 12/13/2008  . URI [J06.9] 12/06/2008  . DYSURIA [R30.0] 12/06/2008  . BACK STRAIN, ACUTE [IMO0002] 11/22/2008  . NECK PAIN, LEFT [M54.2] 11/01/2008  . HEADACHE [R51] 11/01/2008  . SORE THROAT [J02.9] 04/16/2008  . 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 ALLERGIC RHINITIS [J30.9] 11/06/2006  . ACNE, MILD [L70.8] 11/06/2006  . Abdominal pain, unspecified site [R10.9] 11/06/2006   History of Present Illness:  On admission to the ED: 32 y.o. female, in [place] voluntarily due to [place]. Pt reports that for the last couple of days, she's been having continuous intrusive thoughts of suicide. She adds that when she looks at something, she thinks about how she can use it to kill herself (I.e. Sees a knife and thinks about cutting herself, sees her pills and thinks about overdosing). Pt receives med management through [employer] and has had multiple med changes within the past year, with the most recent change from Wellbutrin to Effexor @ 6 weeks ago. Pt denies HI or AVH. Pt is unable to contract for safety.   Today, Patient was seen and examined by psychiatrist and these are his findings.  Patient is a 32 year old female with a  reported past psychiatric history significant for bipolar type II disorder who presented to the [facility] long emergency department with suicidal ideation.  The patient has a complicated history of pharmacological management.  She was originally diagnosed with depression several years ago following opiate substance issues.  She was started on mood stabilizers and antidepressants at that time.  She was seen in the intensive outpatient program at [facility] after that, and there was some speculation that her depression was more related to bipolar 2 disorder.  She was started on mood stabilizers at that time.  She has been followed in an outpatient clinic by Mallie Snooks, MD.  The patient's history is a little bit more complicated, and that he was giving her samples of Vraylar.  Initially she stated that Vraylar was helpful, and she self stopped it because she and her husband were thinking about having another child.  She went to get off her medications.  Her mood and other situations worsened after that.  Dr. Owens Shark was recently hospitalized, and she was unable to get into see him.  He had been giving her samples, and very large is extremely expensive.  She went back to his office and saw a nurse practitioner there 2 days ago.  At that time she was prescribed Effexor XR as well as Latuda.  On the day prior to admission she stated she was thinking about how she could use a knife to kill herself.  She sees a knife in her head and thinks about cutting herself.  She sees pills and thinks about overdosing.  She was admitted to the hospital for evaluation and stabilization.  9/10  depression, denies suicidal ideations but "I wish I was not here (on earth)", anxiety "pretty bad, appetite "not very good", resting in her room on her bed, self isolating.  Associated Signs/Symptoms: Depression Symptoms:  depressed mood, fatigue, difficulty concentrating, anxiety, decreased appetite, (Hypo) Manic Symptoms:  none Anxiety  Symptoms:  none Psychotic Symptoms:  none PTSD Symptoms: NA Total Time spent with patient: 45 minutes  Past Psychiatric History: bipolar disorder  Is the patient at risk to self? Yes.    Has the patient been a risk to self in the past 6 months? Yes.    Has the patient been a risk to self within the distant past? Yes.    Is the patient a risk to others? No.  Has the patient been a risk to others in the past 6 months? No.  Has the patient been a risk to others within the distant past? No.   Prior Inpatient Therapy:   Prior Outpatient Therapy:    Alcohol Screening: 1. How often do you have a drink containing alcohol?: Never 2. How many drinks containing alcohol do you have on a typical day when you are drinking?: 1 or 2 3. How often do you have six or more drinks on one occasion?: Never AUDIT-C Score: 0 4. How often during the last year have you found that you were not able to stop drinking once you had started?: Never 5. How often during the last year have you failed to do what was normally expected from you becasue of drinking?: Never 6. How often during the last year have you needed a first drink in the morning to get yourself going after a heavy drinking session?: Never 7. How often during the last year have you had a feeling of guilt of remorse after drinking?: Never 8. How often during the last year have you been unable to remember what happened the night before because you had been drinking?: Never 9. Have you or someone else been injured as a result of your drinking?: No 10. Has a relative or friend or a doctor or another health worker been concerned about your drinking or suggested you cut down?: No Alcohol Use Disorder Identification Test Final Score (AUDIT): 0 Intervention/Follow-up: AUDIT Score <7 follow-up not indicated Substance Abuse History in the last 12 months:  No. Consequences of Substance Abuse: NA Previous Psychotropic Medications: Yes  Psychological Evaluations:  Yes  Past Medical History:  Past Medical History:  Diagnosis Date  . Depression   . Drug addict ([place])   . Migraine    History reviewed. No pertinent surgical history. Family History: History reviewed. No pertinent family history. Family Psychiatric  History: none Tobacco Screening: Have you used any form of tobacco in the last 30 days? (Cigarettes, Smokeless Tobacco, Cigars, and/or Pipes): Yes Tobacco use, Select all that apply: 5 or more cigarettes per day Are you interested in Tobacco Cessation Medications?: Yes, will notify MD for an order Counseled patient on smoking cessation including recognizing danger situations, developing coping skills and basic information about quitting provided: Refused/Declined practical counseling Social History:  Social History   Substance and Sexual Activity  Alcohol Use No     Social History   Substance and Sexual Activity  Drug Use Not Currently    Additional Social History:  Allergies:   Allergies  Allergen Reactions  . Sulfonamide Derivatives    Lab Results:  Results for orders placed or performed during the hospital encounter of 06/18/17 (from the past 48 hour(s))  Urine  rapid drug screen (hosp performed)     Status: None   Collection Time: 06/18/17 11:11 AM  Result Value Ref Range   Opiates NONE DETECTED NONE DETECTED   Cocaine NONE DETECTED NONE DETECTED   Benzodiazepines NONE DETECTED NONE DETECTED   Amphetamines NONE DETECTED NONE DETECTED   Tetrahydrocannabinol NONE DETECTED NONE DETECTED   Barbiturates NONE DETECTED NONE DETECTED    Comment: (NOTE) DRUG SCREEN FOR MEDICAL PURPOSES ONLY.  IF CONFIRMATION IS NEEDED FOR ANY PURPOSE, NOTIFY LAB WITHIN 5 DAYS. LOWEST DETECTABLE LIMITS FOR URINE DRUG SCREEN Drug Class                     Cutoff (ng/mL) Amphetamine and metabolites    1000 Barbiturate and metabolites    200 Benzodiazepine                 169 Tricyclics and metabolites     300 Opiates and metabolites         300 Cocaine and metabolites        300 THC                            50 Performed at [facility], [place] [address] [place], [place] [address]   Comprehensive metabolic panel     Status: Abnormal   Collection Time: 06/18/17 11:30 AM  Result Value Ref Range   Sodium 141 135 - 145 mmol/L   Potassium 4.4 3.5 - 5.1 mmol/L   Chloride 105 101 - 111 mmol/L   CO2 26 22 - 32 mmol/L   Glucose, Bld 94 65 - 99 mg/dL   BUN 7 6 - 20 mg/dL   Creatinine, Ser 0.74 0.44 - 1.00 mg/dL   Calcium 9.5 8.9 - 10.3 mg/dL   Total Protein 7.9 6.5 - 8.1 g/dL   Albumin 4.4 3.5 - 5.0 g/dL   AST 34 15 - 41 U/L   ALT 12 (L) 14 - 54 U/L   Alkaline Phosphatase 73 38 - 126 U/L   Total Bilirubin 0.4 0.3 - 1.2 mg/dL   GFR calc non Af Amer >60 >60 mL/min   GFR calc Af Amer >60 >60 mL/min    Comment: (NOTE) The eGFR has been calculated using the CKD EPI equation. This calculation has not been validated in all clinical situations. eGFR's persistently <60 mL/min signify possible Chronic Kidney Disease.    Anion gap 10 5 - 15    Comment: Performed at [facility], [place] [address], [place], [place] [address]  Ethanol     Status: None   Collection Time: 06/18/17 11:30 AM  Result Value Ref Range   Alcohol, Ethyl (B) <10 <10 mg/dL    Comment: (NOTE) Lowest detectable limit for serum alcohol is 10 mg/dL. For medical purposes only. Performed at [facility], [place] [address], [place], [place] [address]   CBC with Diff     Status: Abnormal   Collection Time: 06/18/17 11:30 AM  Result Value Ref Range   WBC 10.9 (H) 4.0 - 10.5 K/uL   RBC 4.57 3.87 - 5.11 MIL/uL   Hemoglobin 14.0 12.0 - 15.0 g/dL   HCT 43.2 36.0 - 46.0 %   MCV 94.5 78.0 - 100.0 fL   MCH 30.6 26.0 - 34.0 pg   MCHC 32.4 30.0 - 36.0 g/dL   RDW 13.1 11.5 - 15.5 %   Platelets 559 (H) 150 - 400  K/uL   Neutrophils Relative % 73 %   Neutro Abs 8.0 (H) 1.7 - 7.7 K/uL   Lymphocytes Relative 18 %    Lymphs Abs 1.9 0.7 - 4.0 K/uL   Monocytes Relative 5 %   Monocytes Absolute 0.5 0.1 - 1.0 K/uL   Eosinophils Relative 4 %   Eosinophils Absolute 0.4 0.0 - 0.7 K/uL   Basophils Relative 0 %   Basophils Absolute 0.0 0.0 - 0.1 K/uL    Comment: Performed at [facility], [place] [address] [place], [place] [address]  I-Stat beta hCG blood, ED     Status: None   Collection Time: 06/18/17 11:35 AM  Result Value Ref Range   I-stat hCG, quantitative <5.0 <5 mIU/mL   Comment 3            Comment:   GEST. AGE      CONC.  (mIU/mL)   <=1 WEEK        5 - 50     2 WEEKS       50 - 500     3 WEEKS       100 - 10,000     4 WEEKS     1,000 - 30,000        FEMALE AND NON-PREGNANT FEMALE:     LESS THAN 5 mIU/mL     Blood Alcohol level:  Lab Results  Component Value Date   ETH <10 29/56/2130    Metabolic Disorder Labs:  No results found for: HGBA1C, MPG No results found for: PROLACTIN No results found for: CHOL, TRIG, HDL, CHOLHDL, VLDL, LDLCALC  Current Medications: Current Facility-Administered Medications  Medication Dose Route Frequency Provider Last Rate Last Dose  . acetaminophen (TYLENOL) tablet 650 mg  650 mg Oral Q6H PRN Ethelene Hal, NP      . alum & mag hydroxide-simeth (MAALOX/MYLANTA) 200-200-20 MG/5ML suspension 30 mL  30 mL Oral Q4H PRN Ethelene Hal, NP      . hydrOXYzine (ATARAX/VISTARIL) tablet 25 mg  25 mg Oral Q6H PRN Sharma Covert, MD   25 mg at 06/19/17 0932  . hydrOXYzine (ATARAX/VISTARIL) tablet 50 mg  50 mg Oral QHS Sharma Covert, MD   50 mg at 06/18/17 2151  . lamoTRIgine (LAMICTAL) tablet 100 mg  100 mg Oral Daily Sharma Covert, MD   100 mg at 06/19/17 0834  . lamoTRIgine (LAMICTAL) tablet 200 mg  200 mg Oral QHS Cobos, Myer Peer, MD   200 mg at 06/18/17 2151  . magnesium hydroxide (MILK OF MAGNESIA) suspension 30 mL  30 mL Oral Daily PRN Ethelene Hal, NP      . nicotine (NICODERM CQ - dosed in mg/24 hours)  patch 21 mg  21 mg Transdermal Daily Sharma Covert, MD   21 mg at 06/19/17 0834  . OXcarbazepine (TRILEPTAL) tablet 450 mg  450 mg Oral QHS Sharma Covert, MD      . propranolol (INDERAL) tablet 10 mg  10 mg Oral BID PRN Ethelene Hal, NP      . spironolactone (ALDACTONE) tablet 50 mg  50 mg Oral Daily Sharma Covert, MD   50 mg at 06/19/17 0834  . traZODone (DESYREL) tablet 50 mg  50 mg Oral QHS PRN Sharma Covert, MD   50 mg at 06/18/17 2150  . venlafaxine XR (EFFEXOR-XR) 24 hr capsule 75 mg  75 mg Oral Q breakfast Mallie Darting, Cordie Grice, MD   [phone]  mg at 06/19/17 1610   PTA Medications: Medications Prior to Admission  Medication Sig Dispense Refill Last Dose  . buPROPion (WELLBUTRIN XL) 300 MG 24 hr tablet Take 1 tablet (300 mg total) by mouth daily. 30 tablet 2   . cariprazine (VRAYLAR) capsule Take 3 mg by mouth daily.   Past Week at Unknown time  . cloNIDine (CATAPRES) 0.2 MG tablet Take 1 tablet (0.2 mg total) by mouth at bedtime. (Patient not taking: Reported on 06/18/2017) 30 tablet 2 Not Taking at Unknown time  . gabapentin (NEURONTIN) 300 MG capsule Take 1-2 caps TID (Patient not taking: Reported on 06/18/2017) 168 capsule 0 Not Taking at Unknown time  . lamoTRIgine (LAMICTAL) 200 MG tablet Take 1 tablet (200 mg total) by mouth daily. 30 tablet 2 06/18/2017 at Unknown time  . LORazepam (ATIVAN) 0.5 MG tablet Take 0.5 mg by mouth daily.   06/17/2017 at Unknown time  . lurasidone (LATUDA) 20 MG TABS tablet Take 20 mg by mouth daily.   06/17/2017 at Unknown time  . naltrexone (DEPADE) 50 MG tablet Take 1 tablet (50 mg total) by mouth daily. (Patient not taking: Reported on 06/18/2017) 28 tablet 0 Not Taking at Unknown time  . nicotine (NICODERM CQ) 7 mg/24hr patch Place 1 patch (7 mg total) onto the skin daily. (Patient not taking: Reported on 06/18/2017) 28 patch 0 Not Taking at Unknown time  . propranolol (INDERAL) 10 MG tablet Take for anxiety as directed 28 tablet 0  06/17/2017 at Unknown time  . QUEtiapine (SEROQUEL) 50 MG tablet Take 1 tablet (50 mg total) by mouth at bedtime. 30 tablet 2   . sertraline (ZOLOFT) 100 MG tablet Take 1 tablet (100 mg total) by mouth daily. (Patient not taking: Reported on 06/18/2017) 30 tablet 1 Not Taking at Unknown time  . spironolactone (ALDACTONE) 50 MG tablet Take 50 mg by mouth daily.   06/17/2017 at Unknown time  . traZODone (DESYREL) 100 MG tablet Take 1  To 1&1/2 to 2 tablets HS (Patient not taking: Reported on 06/18/2017) 60 tablet 1 Not Taking at Unknown time  . venlafaxine (EFFEXOR) 75 MG tablet Take 75 mg by mouth daily.   06/18/2017 at Unknown time   Musculoskeletal: Strength & Muscle Tone: within normal limits Gait & Station: normal Patient leans: N/A  Psychiatric Specialty Exam: Physical Exam  Nursing note and vitals reviewed. Constitutional: She is oriented to person, place, and time. She appears well-developed and well-nourished.  Neurological: She is alert and oriented to person, place, and time.    ROS  Blood pressure 105/71, pulse (!) 103, temperature 98.7 F (37.1 C), temperature source Oral, resp. rate 15, height '5\' 5"'$  (1.651 m), weight 66.2 kg (146 lb), SpO2 99 %.Body mass index is 24.3 kg/m.  General Appearance: Disheveled  Eye Contact:  Fair  Speech:  Slow  Volume:  Decreased  Mood:  Depressed  Affect:  Congruent  Thought Process:  Coherent  Orientation:  Full (Time, Place, and Person)  Thought Content:  Logical  Suicidal Thoughts:  Yes.  without intent/plan  Homicidal Thoughts:  No  Memory:  Immediate;   Fair  Judgement:  Impaired  Insight:  Fair  Psychomotor Activity:  Decreased  Concentration:  Concentration: Poor  Recall:  Poor  Fund of Knowledge:  Fair  Language:  Good  Akathisia:  Negative  Handed:  Right  AIMS (if indicated):     Assets:  Desire for Improvement Financial Resources/Insurance Housing Intimacy Physical Health  ADL's:  Intact  Cognition:  WNL  Sleep:   Number of Hours: 6.5    Treatment Plan Summary: Bipolar affective disorder, depressed, severe without psychosis: -Restarted Lamictal 100 mg in the am and 200 mg in the pm for mood stabilization -Restarted Trileptal 450 mg at bedtime for mood and sleep -Restarted Effexor 75 mg daily for depression  Anxiety  -Propranolol 10 mg BID PRN anxiety -Vistaril 25 mg every six hours PRN anxiety  Sleep: -Started Trazodone 50 mg at bedtime PRN sleep -Started Vistaril 50 mg at bedtime PRN sleep  1. Patient was admitted to the Adult unit at [facility] under the service of Dr. Mallie Darting. 2. Routine labs:  Chem profile WDL except ALT 12 H, CBC WDL except WBC 10.9 H, platelets 559 H. Diff WDL except neutrophils 8 H.  Alcohol and toxicology negative along with her pregnancy test. 3. Will maintain Q 15 minutes observation for safety. 4. During this hospitalization the patient will receive psychosocial and education assessment 5. Patient will participate in group, milieu, and family therapy.Psychotherapy: Social and Airline pilot, anti-bullying, learning based strategies, cognitive behavioral, and family object relations individuation separation intervention psychotherapies can be considered. 6. Patient and guardian were educated about medication efficacy and side effects. Patient agreeable with medication trial will speak with guardian.  7. Will continue to monitor patient's mood and behavior. 8. Discharge planning in progress   Observation Level/Precautions:  15 minute checks  Laboratory:  reviewed, stable  Psychotherapy:  Individual and group therapy  Medications:  See MAR  Consultations:  NOne  Discharge Concerns:  NOne  Estimated LOS:  5-7 days  Other:     Physician Treatment Plan for Primary Diagnosis: Bipolar 1 disorder, depressed, severe ([place]) Long Term Goal(s): Improvement in symptoms so as ready for discharge  Short Term Goals: Ability to identify changes  in lifestyle to reduce recurrence of condition will improve, Ability to verbalize feelings will improve, Ability to disclose and discuss suicidal ideas, Ability to demonstrate self-control will improve, Ability to identify and develop effective coping behaviors will improve, Ability to maintain clinical measurements within normal limits will improve and Compliance with prescribed medications will improve  Physician Treatment Plan for Secondary Diagnosis: Principal Problem:   Bipolar 1 disorder, depressed, severe ([place])  Long Term Goal(s): Improvement in symptoms so as ready for discharge  Short Term Goals: Ability to identify changes in lifestyle to reduce recurrence of condition will improve, Ability to verbalize feelings will improve, Ability to disclose and discuss suicidal ideas, Ability to demonstrate self-control will improve, Ability to identify and develop effective coping behaviors will improve, Ability to maintain clinical measurements within normal limits will improve and Compliance with prescribed medications will improve  I certify that inpatient services furnished can reasonably be expected to improve the patient's condition.    Waylan Boga, NP 5/22/20199:34 AM
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
## 2017-06-19 NOTE — BHH Group Notes (Signed)
[facility] Group Therapy 06/19/2017 1:15pm  Type of Therapy: Mental Health Association Presentation  Participation Level: Invited. DID NOT ATTEND. Pt chose to remain in bed.   [employer], LCSW 06/19/2017 10:44 AM

## 2017-06-19 NOTE — Progress Notes (Signed)
Adult Psychoeducational Group Note  Date:  06/19/2017 Time:  9:30 PM  Group Topic/Focus:  Wrap-Up Group:   The focus of this group is to help patients review their daily goal of treatment and discuss progress on daily workbooks.  Participation Level:  Did Not Attend    Gerrit Heck 06/19/2017, 9:30 PM

## 2017-06-19 NOTE — Progress Notes (Signed)
Patient ID: Catherine Joyce, female   DOB: Mar 18, 1985, 32 y.o.   MRN: [id number]  Nursing Progress Note [id number]  Data: Patient presents with anxious mood and depressed/sad affect. Patient requested PRN anti-anxiety medication today and reports she feels overwhelmed. Patient states, "I wish I was dead," but states she has "no plan to hurt myself here". Patient agrees to come to staff if her thoughts intensify or if she feels like harming herself. Patient complaint with scheduled medications. Patient denies HI/AVH or pain. Patient contracts for safety on the unit at this time. Patient completed self-inventory sheet and rates depression, hopelessness, and anxiety 9,9,7 respectively. Patient rates their sleep and appetite as good/fair respectively. Patient states goal for today is to "get out of bed a few times" and "take a shower".   Action: Patient educated about and provided medication per provider's orders. Patient safety maintained with q15 min safety checks. Low fall risk precautions in place. Emotional support given. 1:1 interaction and active listening provided. Patient encouraged to attend meals and groups. Labs, vital signs and patient behavior monitored throughout shift. Patient encouraged to work on treatment plan and goals.  Response: Patient remains safe on the unit at this time. Patient is interacting with peers appropriately on the unit. Will continue to support and monitor.

## 2017-06-20 MED ORDER — HYDROXYZINE HCL 10 MG PO TABS
10.0000 mg | ORAL_TABLET | Freq: Four times a day (QID) | ORAL | Status: DC | PRN
  Administered 2017-06-22 – 2017-06-23 (×2): 10 mg via ORAL
  Filled 2017-06-20 (×2): qty 1

## 2017-06-20 MED ORDER — OXCARBAZEPINE 300 MG PO TABS
600.0000 mg | ORAL_TABLET | Freq: Every day | ORAL | Status: DC
  Administered 2017-06-20 – 2017-06-22 (×3): 600 mg via ORAL
  Filled 2017-06-20 (×4): qty 2

## 2017-06-20 MED ORDER — VENLAFAXINE HCL ER 37.5 MG PO CP24
112.5000 mg | ORAL_CAPSULE | Freq: Every day | ORAL | Status: DC
Start: 2017-06-21 — End: 2017-06-23
  Administered 2017-06-21 – 2017-06-23 (×3): 112.5 mg via ORAL
  Filled 2017-06-20 (×4): qty 3

## 2017-06-20 NOTE — Progress Notes (Signed)
Nursing Progress Note: 7p-7a D: Pt currently presents with a pleasant/anxious/animated affect and behavior. Pt states "I am glad I am off my latuda. I feel much more active now." Interacting minimally with the milieu. Pt reports good sleep during the previous night with current medication regimen. Pt did not attend wrap-up group.  A: Pt provided with medications per providers orders. Pt's labs and vitals were monitored throughout the night. Pt supported emotionally and encouraged to express concerns and questions. Pt educated on medications.  R: Pt's safety ensured with 15 minute and environmental checks. Pt currently denies SI, HI, and AVH. Pt verbally contracts to seek staff if SI,HI, or AVH occurs and to consult with staff before acting on any harmful thoughts. Will continue to monitor.

## 2017-06-20 NOTE — BHH Group Notes (Signed)
Date: 06/20/17, 1315  Type: group therapy  Participate Level: moderate  Description: CSW conducted a check in group with the five participants who attended.  Discussion topics included managing conflict with family, boundaries, and seeking support for yourself.  Summary of progress:Pt was quiet initially but opened up after being asked several questions by CSW.  Pt shared about past involvement with AA and other support groups and talked about getting somewhat tired of groups but knowing that they are good for her.

## 2017-06-20 NOTE — Progress Notes (Signed)
[facility] MD Progress Note  06/20/2017 11:17 AM Catherine Joyce  MRN:  [id number] Subjective: Patient is seen and examined.  Patient is a 32 year old female with a past psychiatric history significant for depression versus bipolar disorder most recently depressed.  She is seen in follow-up.  She feels little bit better today.  She stated she thinks that the [place] being stopped helped.  We spent a great deal of time today going over historical timeframe of her medications.  It seems as though a lot of her medicines have been added to deal with side effects from medications, and thinks it just continued to escalate.  She did not sleep well last night, and we discussed increasing her Trileptal up to 600 mg.  She believes that is what she was taking at home. Principal Problem: Bipolar 1 disorder, depressed, severe ([place]) Diagnosis:   Patient Active Problem List   Diagnosis Date Noted  . Bipolar 1 disorder, depressed, severe ([place]) [F31.4] 06/18/2017  . Elevated liver enzymes [R74.8] 04/26/2016  . GAD (generalized anxiety disorder) [F41.1] 01/25/2016  . VULVITIS [N76.0] 06/21/2009  . OTH D/O MENSTRUATION&OTH ABN BLEED FE GNT TRACT [N94.9] 06/21/2009  . NAUSEA AND VOMITING [R11.2] 04/11/2009  . ABDOMINAL PAIN RIGHT LOWER QUADRANT [R10.31] 04/11/2009  . LARYNGOTRACHEOBRONCHITIS, ACUTE [J05.0] 02/17/2009  . ADVERSE REACTION TO MEDICATION [T50.995A] 12/13/2008  . URI [J06.9] 12/06/2008  . DYSURIA [R30.0] 12/06/2008  . BACK STRAIN, ACUTE [IMO0002] 11/22/2008  . NECK PAIN, LEFT [M54.2] 11/01/2008  . HEADACHE [R51] 11/01/2008  . SORE THROAT [J02.9] 04/16/2008  . 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 ALLERGIC RHINITIS [J30.9] 11/06/2006  . ACNE, MILD [L70.8] 11/06/2006  . Abdominal pain, unspecified site [R10.9] 11/06/2006   Total Time spent with patient: 30 minutes  Past Psychiatric History: See admission H&P  Past Medical History:  Past Medical History:  Diagnosis Date  . Depression   . Drug  addict ([place])   . Migraine    History reviewed. No pertinent surgical history. Family History: History reviewed. No pertinent family history. Family Psychiatric  History: See admission H&P Social History:  Social History   Substance and Sexual Activity  Alcohol Use No     Social History   Substance and Sexual Activity  Drug Use Not Currently    Social History   Socioeconomic History  . Marital status: Married    Spouse name: Not on file  . Number of children: Not on file  . Years of education: Not on file  . Highest education level: Not on file  Occupational History  . Not on file  Social Needs  . Financial resource strain: Not on file  . Food insecurity:    Worry: Not on file    Inability: Not on file  . Transportation needs:    Medical: Not on file    Non-medical: Not on file  Tobacco Use  . Smoking status: Current Some Day Smoker    Types: Cigarettes  . Smokeless tobacco: Current User  . Tobacco comment: Wearing patch from treatment 14/mg  Substance and Sexual Activity  . Alcohol use: No  . Drug use: Not Currently  . Sexual activity: Not on file  Lifestyle  . Physical activity:    Days per week: Not on file    Minutes per session: Not on file  . Stress: Not on file  Relationships  . Social connections:    Talks on phone: Not on file    Gets together: Not on file    Attends religious service:  Not on file    Active member of club or organization: Not on file    Attends meetings of clubs or organizations: Not on file    Relationship status: Not on file  Other Topics Concern  . Not on file  Social History Narrative  . Not on file   Additional Social History:                         Sleep: Poor  Appetite:  Fair  Current Medications: Current Facility-Administered Medications  Medication Dose Route Frequency Provider Last Rate Last Dose  . acetaminophen (TYLENOL) tablet 650 mg  650 mg Oral Q6H PRN Ethelene Hal, NP      . alum & mag  hydroxide-simeth (MAALOX/MYLANTA) 200-200-20 MG/5ML suspension 30 mL  30 mL Oral Q4H PRN Ethelene Hal, NP      . hydrOXYzine (ATARAX/VISTARIL) tablet 10 mg  10 mg Oral Q6H PRN Sharma Covert, MD      . hydrOXYzine (ATARAX/VISTARIL) tablet 50 mg  50 mg Oral QHS Sharma Covert, MD   50 mg at 06/19/17 2107  . lamoTRIgine (LAMICTAL) tablet 100 mg  100 mg Oral Daily Sharma Covert, MD   100 mg at 06/20/17 0824  . lamoTRIgine (LAMICTAL) tablet 200 mg  200 mg Oral QHS Cobos, Myer Peer, MD   200 mg at 06/19/17 2107  . magnesium hydroxide (MILK OF MAGNESIA) suspension 30 mL  30 mL Oral Daily PRN Ethelene Hal, NP      . nicotine (NICODERM CQ - dosed in mg/24 hours) patch 21 mg  21 mg Transdermal Daily Sharma Covert, MD   21 mg at 06/20/17 0355  . Oxcarbazepine (TRILEPTAL) tablet 600 mg  600 mg Oral QHS Sharma Covert, MD      . propranolol (INDERAL) tablet 10 mg  10 mg Oral BID PRN Ethelene Hal, NP      . spironolactone (ALDACTONE) tablet 50 mg  50 mg Oral Daily Sharma Covert, MD   50 mg at 06/20/17 9741  . traZODone (DESYREL) tablet 50 mg  50 mg Oral QHS PRN Sharma Covert, MD   50 mg at 06/20/17 0110  . [START ON 06/21/2017] venlafaxine XR (EFFEXOR-XR) 24 hr capsule 112.5 mg  112.5 mg Oral Q breakfast Sharma Covert, MD        Lab Results:  Results for orders placed or performed during the hospital encounter of 06/18/17 (from the past 48 hour(s))  Comprehensive metabolic panel     Status: Abnormal   Collection Time: 06/18/17 11:30 AM  Result Value Ref Range   Sodium 141 135 - 145 mmol/L   Potassium 4.4 3.5 - 5.1 mmol/L   Chloride 105 101 - 111 mmol/L   CO2 26 22 - 32 mmol/L   Glucose, Bld 94 65 - 99 mg/dL   BUN 7 6 - 20 mg/dL   Creatinine, Ser 0.74 0.44 - 1.00 mg/dL   Calcium 9.5 8.9 - 10.3 mg/dL   Total Protein 7.9 6.5 - 8.1 g/dL   Albumin 4.4 3.5 - 5.0 g/dL   AST 34 15 - 41 U/L   ALT 12 (L) 14 - 54 U/L   Alkaline Phosphatase 73 38  - 126 U/L   Total Bilirubin 0.4 0.3 - 1.2 mg/dL   GFR calc non Af Amer >60 >60 mL/min   GFR calc Af Amer >60 >60 mL/min    Comment: (NOTE) The eGFR  has been calculated using the CKD EPI equation. This calculation has not been validated in all clinical situations. eGFR's persistently <60 mL/min signify possible Chronic Kidney Disease.    Anion gap 10 5 - 15    Comment: Performed at [facility], [place] [address] [place], [place] [address]  Ethanol     Status: None   Collection Time: 06/18/17 11:30 AM  Result Value Ref Range   Alcohol, Ethyl (B) <10 <10 mg/dL    Comment: (NOTE) Lowest detectable limit for serum alcohol is 10 mg/dL. For medical purposes only. Performed at [facility], [place] [address] [place], [place] [address]   CBC with Diff     Status: Abnormal   Collection Time: 06/18/17 11:30 AM  Result Value Ref Range   WBC 10.9 (H) 4.0 - 10.5 K/uL   RBC 4.57 3.87 - 5.11 MIL/uL   Hemoglobin 14.0 12.0 - 15.0 g/dL   HCT 43.2 36.0 - 46.0 %   MCV 94.5 78.0 - 100.0 fL   MCH 30.6 26.0 - 34.0 pg   MCHC 32.4 30.0 - 36.0 g/dL   RDW 13.1 11.5 - 15.5 %   Platelets 559 (H) 150 - 400 K/uL   Neutrophils Relative % 73 %   Neutro Abs 8.0 (H) 1.7 - 7.7 K/uL   Lymphocytes Relative 18 %   Lymphs Abs 1.9 0.7 - 4.0 K/uL   Monocytes Relative 5 %   Monocytes Absolute 0.5 0.1 - 1.0 K/uL   Eosinophils Relative 4 %   Eosinophils Absolute 0.4 0.0 - 0.7 K/uL   Basophils Relative 0 %   Basophils Absolute 0.0 0.0 - 0.1 K/uL    Comment: Performed at [facility], [place] [address], [place], [place] [address]  I-Stat beta hCG blood, ED     Status: None   Collection Time: 06/18/17 11:35 AM  Result Value Ref Range   I-stat hCG, quantitative <5.0 <5 mIU/mL   Comment 3            Comment:   GEST. AGE      CONC.  (mIU/mL)   <=1 WEEK        5 - 50     2 WEEKS       50 - 500     3 WEEKS       100 - 10,000     4 WEEKS     1,000 - 30,000         FEMALE AND NON-PREGNANT FEMALE:     LESS THAN 5 mIU/mL     Blood Alcohol level:  Lab Results  Component Value Date   ETH <10 02/40/9735    Metabolic Disorder Labs: No results found for: HGBA1C, MPG No results found for: PROLACTIN No results found for: CHOL, TRIG, HDL, CHOLHDL, VLDL, LDLCALC  Physical Findings: AIMS: Facial and Oral Movements Muscles of Facial Expression: None, normal Lips and Perioral Area: None, normal Jaw: None, normal Tongue: None, normal,Extremity Movements Upper (arms, wrists, hands, fingers): None, normal Lower (legs, knees, ankles, toes): None, normal, Trunk Movements Neck, shoulders, hips: None, normal, Overall Severity Severity of abnormal movements (highest score from questions above): None, normal Incapacitation due to abnormal movements: None, normal Patient's awareness of abnormal movements (rate only patient's report): No Awareness, Dental Status Current problems with teeth and/or dentures?: No Does patient usually wear dentures?: No  CIWA:    COWS:     Musculoskeletal: Strength & Muscle Tone: within normal limits Gait &  Station: normal Patient leans: N/A  Psychiatric Specialty Exam: Physical Exam  Nursing note and vitals reviewed. Constitutional: She is oriented to person, place, and time. She appears well-developed and well-nourished.  HENT:  Head: Normocephalic and atraumatic.  Respiratory: Effort normal.  Neurological: She is alert and oriented to person, place, and time.    ROS  Blood pressure 105/71, pulse (!) 103, temperature 98.7 F (37.1 C), temperature source Oral, resp. rate 15, height '5\' 5"'$  (1.651 m), weight 66.2 kg (146 lb), SpO2 99 %.Body mass index is 24.3 kg/m.  General Appearance: Casual  Eye Contact:  Fair  Speech:  Normal Rate  Volume:  Decreased  Mood:  Depressed  Affect:  Appropriate  Thought Process:  Coherent  Orientation:  Full (Time, Place, and Person)  Thought Content:  Logical  Suicidal Thoughts:   Yes.  without intent/plan  Homicidal Thoughts:  No  Memory:  Immediate;   Good  Judgement:  Intact  Insight:  Fair  Psychomotor Activity:  Increased  Concentration:  Concentration: Fair  Recall:  [place] of Knowledge:  Good  Language:  Good  Akathisia:  Negative  Handed:  Right  AIMS (if indicated):     Assets:  Communication Skills Desire for Improvement Financial Resources/Insurance Housing Resilience  ADL's:  Intact  Cognition:  WNL  Sleep:  Number of Hours: 6     Treatment Plan Summary: Daily contact with patient to assess and evaluate symptoms and progress in treatment, Medication management and Plan Patient is seen and examined.  Patient is a 32 year old female with the above-stated past psychiatric history is seen in follow-up.  She is doing a bit better today, and I think that is mainly because the stoppage of the [place].  After further discussion and the Latuda was probably started as a mood stabilizer that would be relatively safe in pregnancy given her thoughts of trying to have another baby.  We went into more detail about the historical perspective of her medications, and a lot of her medicines have just been added because of side effects from previous medicines.  Because of her depression and Emina go on and increase her Effexor XR 212.5 mg p.o. daily today.  Were not can make any other changes except reducing the hydroxyzine during the day down to 10 mg to reduce daytime sedation.  Hopefully she will tolerate this.  Hopefully we can consolidate her medications into a more sensible regimen.  Sharma Covert, MD 06/20/2017, 11:17 AM

## 2017-06-20 NOTE — Progress Notes (Signed)
Patient states that she had a "relaxing" day overall. She states that she had a good talk with her husband, but is nervous since she called her employer and that they did not sound supportive of her needs. Her goal for tomorrow is to try to get more rest since she normally has a great deal of difficulty sleeping straight thru the night.

## 2017-06-20 NOTE — BHH Suicide Risk Assessment (Signed)
[place] INPATIENT:  Family/Significant Other Suicide Prevention Education  Suicide Prevention Education:  Education Completed; Dajanique Robley, husband, [phone], has been identified by the patient as the family member/significant other with whom the patient will be residing, and identified as the person(s) who will aid the patient in the event of a mental health crisis (suicidal ideations/suicide attempt).  With written consent from the patient, the family member/significant other has been provided the following suicide prevention education, prior to the and/or following the discharge of the patient.  The suicide prevention education provided includes the following:  Suicide risk factors  Suicide prevention and interventions  National Suicide Hotline telephone number  [facility] assessment telephone number  [facility] Emergency Assistance [place] and/or Residential Mobile Crisis Unit telephone number  Request made of family/significant other to:  Remove weapons (e.g., guns, rifles, knives), all items previously/currently identified as safety concern.  No guns, per husband.  Remove drugs/medications (over-the-counter, prescriptions, illicit drugs), all items previously/currently identified as a safety concern.  The family member/significant other verbalizes understanding of the suicide prevention education information provided.  The family member/significant other agrees to remove the items of safety concern listed above.  Husband reports that pt is having medication changes "a lot."  This most recent episode came somewhat out of the blue--pt had not mentioned that she was struggling.  Pt met with the new MD who put her on a new antipsychotic and the next day pt ended up not getting up in the morning and started having "crazy thoughts" when she did get up.  Husband said he does not think pt needs all these medications and thinks she does worse on them.  Pt  said pt appears to do best on wellbutrin and things get worse when they take her off of wellbutrin.  Pt does have a lot of work stress right now.  Joanne Chars, LCSW 06/20/2017, 10:40 AM

## 2017-06-20 NOTE — Plan of Care (Signed)
  Problem: Education: Goal: Emotional status will improve Outcome: Progressing Goal: Mental status will improve Outcome: Progressing   Problem: Activity: Goal: Sleeping patterns will improve Outcome: Progressing   Problem: Coping: Goal: Ability to demonstrate self-control will improve Outcome: Progressing   Problem: Health Behavior/Discharge Planning: Goal: Compliance with treatment plan for underlying cause of condition will improve Outcome: Progressing   Problem: Safety: Goal: Periods of time without injury will increase Outcome: Progressing   Problem: Coping: Goal: Will verbalize feelings Outcome: Progressing   Problem: Health Behavior/Discharge Planning: Goal: Ability to make decisions will improve Outcome: Progressing   Problem: Safety: Goal: Ability to disclose and discuss suicidal ideas will improve Outcome: Progressing   Problem: Coping: Goal: Coping ability will improve Outcome: Progressing

## 2017-06-20 NOTE — Progress Notes (Signed)
D: Patient has brighter affect today.  She continues to report poor sleep.  She continues to have anxiety; her mood is stable and pleasant.  She is not as tearful today.  Patient is attending most of the groups with participation and engagement.  She denies any thoughts of self harm today.  She rates her depression and hopelessness as a 7; anxiety is rated a 6.  She continues to report poor concentration and low energy level.  Her goal today is to "get my medications right."    A: Continue to monitor medication management and MD orders.  Safety checks continued every 15 minutes per protocol.  Offer support and encouragement as needed.  R: Patient is receptive to staff; her behavior is appropriate.

## 2017-06-20 NOTE — BHH Group Notes (Signed)
BHH Group Notes:  Nursing Education  Date:  06/20/2017  Time:  4:00 PM  Type of Therapy:  Nurse Education  Participation Level:  Active  Participation Quality:  Appropriate, Attentive, Sharing and Supportive  Affect:  Appropriate  Cognitive:  Alert and Oriented  Insight:  Appropriate and Improving  Engagement in Group:  Developing/Improving  Modes of Intervention:  Discussion and Education  Summary of Progress/Problems: Patient attended group late. Patient reports she was sleeping but did join when she woke up. Patient reports she is trying to keep and open mind and learn but is frustrated to be back in-patient. 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  Rae Lips Eldena Dede 06/20/2017, 6:36 PM

## 2017-06-21 MED ORDER — DOXEPIN HCL 25 MG PO CAPS
50.0000 mg | ORAL_CAPSULE | Freq: Every day | ORAL | Status: DC
  Administered 2017-06-21: 50 mg via ORAL
  Filled 2017-06-21: qty 2
  Filled 2017-06-21 (×2): qty 1

## 2017-06-21 MED ORDER — DOXEPIN HCL 25 MG PO CAPS
25.0000 mg | ORAL_CAPSULE | Freq: Every evening | ORAL | Status: DC | PRN
  Administered 2017-06-22: 25 mg via ORAL
  Filled 2017-06-21: qty 1

## 2017-06-21 NOTE — Progress Notes (Signed)
DAR Note: Pt observed in the dayroom not interacting. Pt appeared to be anxious. Pt at of assessment endorsed moderate anxiety with moderate depression; "I feel better today." Pt denied SI/HI, AVH or pain. Pt contracts for safety. Medications offered as prescribed. All patient's questions and concerns addressed. Support, encouragement, and safe environment provided. 15-minute safety checks continue. Pt was med compliant. Safety checks continue.

## 2017-06-21 NOTE — Progress Notes (Signed)
[facility] MD Progress Note  06/21/2017 11:12 AM Taysia Rivere  MRN:  [id number] Subjective: Patient is seen and examined.  Patient is a 32 year old female with past psychiatric history significant for major depression, and previous concern for either bipolar disorder type I or type II.  She is seen in follow-up.  Her mood remains all right.  She still having depressive symptoms, but feels better than she did on admission.  I suspect that much of that was because of the [place].  Her anxiety is stable, but she still not sleeping well.  The Trileptal at 600 mg really did not put a dent in that.  We discussed options today.  We had previously discussed using doxepin as an option.  We discussed how I feel like consolidation of her medicines is in her best interest.  She denied any suicidal ideation today. Principal Problem: Bipolar 1 disorder, depressed, severe (HCC) Diagnosis:   Patient Active Problem List   Diagnosis Date Noted  . Bipolar 1 disorder, depressed, severe (HCC) [F31.4] 06/18/2017  . Elevated liver enzymes [R74.8] 04/26/2016  . GAD (generalized anxiety disorder) [F41.1] 01/25/2016  . VULVITIS [N76.0] 06/21/2009  . OTH D/O MENSTRUATION&OTH ABN BLEED FE GNT TRACT [N94.9] 06/21/2009  . NAUSEA AND VOMITING [R11.2] 04/11/2009  . ABDOMINAL PAIN RIGHT LOWER QUADRANT [R10.31] 04/11/2009  . LARYNGOTRACHEOBRONCHITIS, ACUTE [J05.0] 02/17/2009  . ADVERSE REACTION TO MEDICATION [T50.995A] 12/13/2008  . URI [J06.9] 12/06/2008  . DYSURIA [R30.0] 12/06/2008  . BACK STRAIN, ACUTE [IMO0002] 11/22/2008  . NECK PAIN, LEFT [M54.2] 11/01/2008  . HEADACHE [R51] 11/01/2008  . SORE THROAT [J02.9] 04/16/2008  . 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 ALLERGIC RHINITIS [J30.9] 11/06/2006  . ACNE, MILD [L70.8] 11/06/2006  . Abdominal pain, unspecified site [R10.9] 11/06/2006   Total Time spent with patient: 30 minutes  Past Psychiatric History: The admission H&P  Past Medical History:  Past Medical  History:  Diagnosis Date  . Depression   . Drug addict (HCC)   . Migraine    History reviewed. No pertinent surgical history. Family History: History reviewed. No pertinent family history. Family Psychiatric  History: See admission H&P Social History:  Social History   Substance and Sexual Activity  Alcohol Use No     Social History   Substance and Sexual Activity  Drug Use Not Currently    Social History   Socioeconomic History  . Marital status: Married    Spouse name: Not on file  . Number of children: Not on file  . Years of education: Not on file  . Highest education level: Not on file  Occupational History  . Not on file  Social Needs  . Financial resource strain: Not on file  . Food insecurity:    Worry: Not on file    Inability: Not on file  . Transportation needs:    Medical: Not on file    Non-medical: Not on file  Tobacco Use  . Smoking status: Current Some Day Smoker    Types: Cigarettes  . Smokeless tobacco: Current User  . Tobacco comment: Wearing patch from treatment 14/mg  Substance and Sexual Activity  . Alcohol use: No  . Drug use: Not Currently  . Sexual activity: Not on file  Lifestyle  . Physical activity:    Days per week: Not on file    Minutes per session: Not on file  . Stress: Not on file  Relationships  . Social connections:    Talks on phone: Not on file  Gets together: Not on file    Attends religious service: Not on file    Active member of club or organization: Not on file    Attends meetings of clubs or organizations: Not on file    Relationship status: Not on file  Other Topics Concern  . Not on file  Social History Narrative  . Not on file   Additional Social History:                         Sleep: Poor  Appetite:  Fair  Current Medications: Current Facility-Administered Medications  Medication Dose Route Frequency Provider Last Rate Last Dose  . acetaminophen (TYLENOL) tablet 650 mg  650 mg Oral  Q6H PRN Laveda Abbe, NP      . alum & mag hydroxide-simeth (MAALOX/MYLANTA) 200-200-20 MG/5ML suspension 30 mL  30 mL Oral Q4H PRN Laveda Abbe, NP      . doxepin (SINEQUAN) capsule 25 mg  25 mg Oral QHS PRN Antonieta Pert, MD      . doxepin (SINEQUAN) capsule 50 mg  50 mg Oral QHS Antonieta Pert, MD      . hydrOXYzine (ATARAX/VISTARIL) tablet 10 mg  10 mg Oral Q6H PRN Antonieta Pert, MD      . hydrOXYzine (ATARAX/VISTARIL) tablet 50 mg  50 mg Oral QHS Antonieta Pert, MD   50 mg at 06/20/17 2123  . lamoTRIgine (LAMICTAL) tablet 100 mg  100 mg Oral Daily Antonieta Pert, MD   100 mg at 06/21/17 0741  . lamoTRIgine (LAMICTAL) tablet 200 mg  200 mg Oral QHS Cobos, Rockey Situ, MD   200 mg at 06/20/17 2122  . magnesium hydroxide (MILK OF MAGNESIA) suspension 30 mL  30 mL Oral Daily PRN Laveda Abbe, NP      . nicotine (NICODERM CQ - dosed in mg/24 hours) patch 21 mg  21 mg Transdermal Daily Antonieta Pert, MD   21 mg at 06/21/17 0744  . Oxcarbazepine (TRILEPTAL) tablet 600 mg  600 mg Oral QHS Antonieta Pert, MD   600 mg at 06/20/17 2122  . propranolol (INDERAL) tablet 10 mg  10 mg Oral BID PRN Laveda Abbe, NP      . spironolactone (ALDACTONE) tablet 50 mg  50 mg Oral Daily Antonieta Pert, MD   50 mg at 06/21/17 0741  . venlafaxine XR (EFFEXOR-XR) 24 hr capsule 112.5 mg  112.5 mg Oral Q breakfast Antonieta Pert, MD   112.5 mg at 06/21/17 1610    Lab Results: No results found for this or any previous visit (from the past 48 hour(s)).  Blood Alcohol level:  Lab Results  Component Value Date   ETH <10 06/18/2017    Metabolic Disorder Labs: No results found for: HGBA1C, MPG No results found for: PROLACTIN No results found for: CHOL, TRIG, HDL, CHOLHDL, VLDL, LDLCALC  Physical Findings: AIMS: Facial and Oral Movements Muscles of Facial Expression: None, normal Lips and Perioral Area: None, normal Jaw: None, normal Tongue:  None, normal,Extremity Movements Upper (arms, wrists, hands, fingers): None, normal Lower (legs, knees, ankles, toes): None, normal, Trunk Movements Neck, shoulders, hips: None, normal, Overall Severity Severity of abnormal movements (highest score from questions above): None, normal Incapacitation due to abnormal movements: None, normal Patient's awareness of abnormal movements (rate only patient's report): No Awareness, Dental Status Current problems with teeth and/or dentures?: No Does patient usually wear dentures?: No  CIWA:  COWS:     Musculoskeletal: Strength & Muscle Tone: within normal limits Gait & Station: normal Patient leans: N/A  Psychiatric Specialty Exam: Physical Exam  Nursing note and vitals reviewed. Constitutional: She is oriented to person, place, and time. She appears well-developed and well-nourished.  Respiratory: Effort normal.  Musculoskeletal: Normal range of motion.  Neurological: She is alert and oriented to person, place, and time.    ROS  Blood pressure 96/73, pulse 100, temperature 98.6 F (37 C), temperature source Oral, resp. rate 15, height  (1.651 m), weight 66.2 kg (146 lb), SpO2 99 %.Body mass index is 24.3 kg/m.  General Appearance: Casual  Eye Contact:  Fair  Speech:  Normal Rate  Volume:  Normal  Mood:  Depressed  Affect:  Congruent  Thought Process:  Coherent  Orientation:  Full (Time, Place, and Person)  Thought Content:  Logical  Suicidal Thoughts:  No  Homicidal Thoughts:  No  Memory:  Immediate;   Fair  Judgement:  Intact  Insight:  Fair  Psychomotor Activity:  Decreased  Concentration:  Concentration: Fair  Recall:  [employer] of Knowledge:  Fair  Language:  Good  Akathisia:  Negative  Handed:  Right  AIMS (if indicated):     Assets:  Communication Skills Desire for Improvement Financial Resources/Insurance Housing Intimacy Physical Health Resilience Social Support  ADL's:  Intact  Cognition:  WNL   Sleep:  Number of Hours: 6.75     Treatment Plan Summary: Daily contact with patient to assess and evaluate symptoms and progress in treatment, Medication management and Plan Patient is seen and examined.  Patient is a 31 year old female with the above-stated past psychiatric history seen in follow-up.  She is perhaps slightly better than yesterday.  Her Effexor XR was increased to 112.5 mg yesterday.  She is tolerating that without difficulty.  She still not sleeping.  I decided to get rid of the trazodone, and will try doxepin.  We will start at 50 mg p.o. nightly, and if that is not effective then she can take an extra 25 to a total dose of 75 mg.  After we have reviewed her symptoms repeatedly I still am not hearing any evidence of any manic episodes.  We have discussed this in detail.  We will just have to monitor this.  I think the goal long-term of hers consolidate her medications.  Antonieta Pert, MD 06/21/2017, 11:12 AM

## 2017-06-21 NOTE — Plan of Care (Signed)
Patient self inventory: Patient slept fair last night, sleep medication helped (trazedone) but pt feels "groggy."Appetite has been good as well as concentration. Depression is 5 out of 10, hopelessness is 2 out of 10, and anxiety is 5 out of 10. Denies SI/HI/AVH. Denies physical pain. Patient's goal is to "get my medications figured out by meeting with the doctor."  Patient compliant with all medications and all questions were answered. Patient concerned about sleep medications, but issues were addressed with MD . Safety maintained with 15 minute checks.   Problem: Coping: Goal: Will verbalize feelings Outcome: Progressing  Patient and RN discussed how she is anxious about returning to work. Support and encouragement provided.

## 2017-06-22 MED ORDER — IMIPRAMINE PAMOATE 75 MG PO CAPS
75.0000 mg | ORAL_CAPSULE | Freq: Every day | ORAL | Status: DC
  Administered 2017-06-22: 75 mg via ORAL
  Filled 2017-06-22 (×2): qty 1

## 2017-06-22 NOTE — Plan of Care (Signed)
  Problem: Education: Goal: Emotional status will improve Outcome: Progressing Goal: Mental status will improve Outcome: Progressing   Problem: Coping: Goal: Coping ability will improve Outcome: Progressing   

## 2017-06-22 NOTE — Progress Notes (Signed)
Adult Psychoeducational Group Note  Date:  06/22/2017 Time:  1:38 PM  Group Topic/Focus:  Goals Group:   The focus of this group is to help patients establish daily goals to achieve during treatment and discuss how the patient can incorporate goal setting into their daily lives to aide in recovery.  Participation Level:  Active  Participation Quality:  Appropriate  Affect:  Appropriate  Cognitive:  Alert  Insight: Good  Engagement in Group:  Engaged  Modes of Intervention:  Problem-solving  Additional Comments:  Pt was active in group  Catherine Joyce, Catherine Joyce 06/22/2017, 1:38 PM

## 2017-06-22 NOTE — Progress Notes (Signed)
D. Pt presents with an appropriate affect and calm, cooperative behavior- pleasant and friendly during interactions. Pt observed initially interacting well with peers in milieu. Per pt's self inventory, pt rates her depression, hopelessness and anxiety a 4/0/4, respectively. Pt writes that her most important goal today is "to figure out my discharge plan-we've talked some about it but its not finalized". Pt currently denies SI/HI and AV hallucinations. A. Labs and vitals monitored. Pt compliant with medications. Pt supported emotionally and encouraged to express concerns and ask questions.   R. Pt remains safe with 15 minute checks. Will continue POC.

## 2017-06-22 NOTE — Progress Notes (Signed)
[facility] Group Notes:  (Nursing/MHT/Case Management/Adjunct)  Date:  06/21/2017 Time:  2030 Type of Therapy:  wrap up group  Participation Level:  Active  Participation Quality:  Appropriate, Attentive, Sharing and Supportive  Affect:  Appropriate  Cognitive:  Alert and Appropriate  Insight:  Good  Engagement in Group:  Engaged and Supportive  Modes of Intervention:  Clarification, Education and Support  Summary of Progress/Problems: Pt reports having had a good visit with the MD and feels good about the medicine adjustments they have made. Pt also shared that she made an after discharge recovery plan with a peer support specialist outlining several goals for continued recovery. 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 Pt is grateful for her 2.38year old son.   Johann Capers S 06/22/2017, 2:24 AM

## 2017-06-22 NOTE — Progress Notes (Signed)
[facility] MD Progress Note  06/22/2017 10:40 AM Catherine Joyce  MRN:  [id number] Subjective: Patient is seen and examined.  Patient is a 32 year old female with a past psychiatric history significant for major depression, recurrent, severe without psychotic features versus bipolar disorder type I, most recently depressed, severe versus bipolar disorder type II with depression.  She is seen in follow-up.  The doxepin was not successful last night.  She had to take 75 and she really got very little sleep.  We discussed options.  Given her substance history I am very reluctant to give her any of the Z drugs for insomnia, and again we discussed the possibility of Lunesta.  She had taken that previously.  I told her my feeling was that the [facility] with eventually become ineffective and she be back to square one.  She also admitted she had previously taken desipramine.  We discussed the possibility of adding that at bedtime.  Her mood continues to improve.  She is mildly anxious.  She denied any side effects to her current medications outside of feeling lethargic because the lack of sleep and doxepin, and no suicidal ideation. Principal Problem: Bipolar 1 disorder, depressed, severe (HCC) Diagnosis:   Patient Active Problem List   Diagnosis Date Noted  . Bipolar 1 disorder, depressed, severe (HCC) [F31.4] 06/18/2017  . Elevated liver enzymes [R74.8] 04/26/2016  . GAD (generalized anxiety disorder) [F41.1] 01/25/2016  . VULVITIS [N76.0] 06/21/2009  . OTH D/O MENSTRUATION&OTH ABN BLEED FE GNT TRACT [N94.9] 06/21/2009  . NAUSEA AND VOMITING [R11.2] 04/11/2009  . ABDOMINAL PAIN RIGHT LOWER QUADRANT [R10.31] 04/11/2009  . LARYNGOTRACHEOBRONCHITIS, ACUTE [J05.0] 02/17/2009  . ADVERSE REACTION TO MEDICATION [T50.995A] 12/13/2008  . URI [J06.9] 12/06/2008  . DYSURIA [R30.0] 12/06/2008  . BACK STRAIN, ACUTE [IMO0002] 11/22/2008  . NECK PAIN, LEFT [M54.2] 11/01/2008  . HEADACHE [R51] 11/01/2008  . SORE THROAT [J02.9]  04/16/2008  . 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 ALLERGIC RHINITIS [J30.9] 11/06/2006  . ACNE, MILD [L70.8] 11/06/2006  . Abdominal pain, unspecified site [R10.9] 11/06/2006   Total Time spent with patient: 30 minutes  Past Psychiatric History: See admission H&P  Past Medical History:  Past Medical History:  Diagnosis Date  . Depression   . Drug addict (HCC)   . Migraine    History reviewed. No pertinent surgical history. Family History: History reviewed. No pertinent family history. Family Psychiatric  History: See admission H&P Social History:  Social History   Substance and Sexual Activity  Alcohol Use No     Social History   Substance and Sexual Activity  Drug Use Not Currently    Social History   Socioeconomic History  . Marital status: Married    Spouse name: Not on file  . Number of children: Not on file  . Years of education: Not on file  . Highest education level: Not on file  Occupational History  . Not on file  Social Needs  . Financial resource strain: Not on file  . Food insecurity:    Worry: Not on file    Inability: Not on file  . Transportation needs:    Medical: Not on file    Non-medical: Not on file  Tobacco Use  . Smoking status: Current Some Day Smoker    Types: Cigarettes  . Smokeless tobacco: Current User  . Tobacco comment: Wearing patch from treatment 14/mg  Substance and Sexual Activity  . Alcohol use: No  . Drug use: Not Currently  . Sexual activity: Not on  file  Lifestyle  . Physical activity:    Days per week: Not on file    Minutes per session: Not on file  . Stress: Not on file  Relationships  . Social connections:    Talks on phone: Not on file    Gets together: Not on file    Attends religious service: Not on file    Active member of club or organization: Not on file    Attends meetings of clubs or organizations: Not on file    Relationship status: Not on file  Other Topics Concern  . Not on file  Social  History Narrative  . Not on file   Additional Social History:                         Sleep: Poor  Appetite:  Fair  Current Medications: Current Facility-Administered Medications  Medication Dose Route Frequency Provider Last Rate Last Dose  . acetaminophen (TYLENOL) tablet 650 mg  650 mg Oral Q6H PRN Laveda Abbe, NP      . alum & mag hydroxide-simeth (MAALOX/MYLANTA) 200-200-20 MG/5ML suspension 30 mL  30 mL Oral Q4H PRN Laveda Abbe, NP      . hydrOXYzine (ATARAX/VISTARIL) tablet 10 mg  10 mg Oral Q6H PRN Antonieta Pert, MD      . hydrOXYzine (ATARAX/VISTARIL) tablet 50 mg  50 mg Oral QHS Antonieta Pert, MD   50 mg at 06/21/17 2136  . imipramine (TOFRANIL-PM) capsule 100 mg  100 mg Oral QHS Antonieta Pert, MD      . lamoTRIgine (LAMICTAL) tablet 100 mg  100 mg Oral Daily Antonieta Pert, MD   100 mg at 06/22/17 0754  . lamoTRIgine (LAMICTAL) tablet 200 mg  200 mg Oral QHS Cobos, Rockey Situ, MD   200 mg at 06/21/17 2136  . magnesium hydroxide (MILK OF MAGNESIA) suspension 30 mL  30 mL Oral Daily PRN Laveda Abbe, NP      . nicotine (NICODERM CQ - dosed in mg/24 hours) patch 21 mg  21 mg Transdermal Daily Antonieta Pert, MD   21 mg at 06/22/17 0755  . Oxcarbazepine (TRILEPTAL) tablet 600 mg  600 mg Oral QHS Antonieta Pert, MD   600 mg at 06/21/17 2136  . propranolol (INDERAL) tablet 10 mg  10 mg Oral BID PRN Laveda Abbe, NP      . spironolactone (ALDACTONE) tablet 50 mg  50 mg Oral Daily Antonieta Pert, MD   50 mg at 06/22/17 0754  . venlafaxine XR (EFFEXOR-XR) 24 hr capsule 112.5 mg  112.5 mg Oral Q breakfast Antonieta Pert, MD   112.5 mg at 06/22/17 4098    Lab Results: No results found for this or any previous visit (from the past 48 hour(s)).  Blood Alcohol level:  Lab Results  Component Value Date   ETH <10 06/18/2017    Metabolic Disorder Labs: No results found for: HGBA1C, MPG No results found  for: PROLACTIN No results found for: CHOL, TRIG, HDL, CHOLHDL, VLDL, LDLCALC  Physical Findings: AIMS: Facial and Oral Movements Muscles of Facial Expression: None, normal Lips and Perioral Area: None, normal Jaw: None, normal Tongue: None, normal,Extremity Movements Upper (arms, wrists, hands, fingers): None, normal Lower (legs, knees, ankles, toes): None, normal, Trunk Movements Neck, shoulders, hips: None, normal, Overall Severity Severity of abnormal movements (highest score from questions above): None, normal Incapacitation due to abnormal movements: None, normal  Patient's awareness of abnormal movements (rate only patient's report): No Awareness, Dental Status Current problems with teeth and/or dentures?: No Does patient usually wear dentures?: No  CIWA:    COWS:     Musculoskeletal: Strength & Muscle Tone: within normal limits Gait & Station: normal Patient leans: N/A  Psychiatric Specialty Exam: Physical Exam  Nursing note and vitals reviewed. Constitutional: She is oriented to person, place, and time. She appears well-developed and well-nourished.  HENT:  Head: Normocephalic and atraumatic.  Respiratory: Effort normal.  Musculoskeletal: Normal range of motion.  Neurological: She is alert and oriented to person, place, and time.    ROS  Blood pressure 106/76, pulse 99, temperature 97.8 F (36.6 C), temperature source Oral, resp. rate 20, height  (1.651 m), weight 66.2 kg (146 lb), SpO2 99 %.Body mass index is 24.3 kg/m.  General Appearance: Casual  Eye Contact:  Fair  Speech:  Normal Rate  Volume:  Decreased  Mood:  Depressed  Affect:  Congruent  Thought Process:  Coherent  Orientation:  Full (Time, Place, and Person)  Thought Content:  Logical  Suicidal Thoughts:  No  Homicidal Thoughts:  No  Memory:  Immediate;   Fair  Judgement:  Intact  Insight:  Fair  Psychomotor Activity:  Increased  Concentration:  Concentration: Fair  Recall:  Fair  Fund of  Knowledge:  Good  Language:  Fair  Akathisia:  Negative  Handed:  Right  AIMS (if indicated):     Assets:  Communication Skills Desire for Improvement Financial Resources/Insurance Housing Intimacy Physical Health Resilience Social Support  ADL's:  Intact  Cognition:  WNL  Sleep:  Number of Hours: 6.25     Treatment Plan Summary: Daily contact with patient to assess and evaluate symptoms and progress in treatment, Medication management and Plan Patient is seen and examined.  Patient is a 32 year old female with the above-stated past psychiatric history seen in follow-up.  I am can stop the doxepin tonight.  We will go with 100 mg of desipramine.  Hopefully this will help her sleep.  If this fails or she has side effects to it, then I will consider Lunesta.  Her mood continues to slowly improve, and she just had the Effexor increased to 112 mg yesterday.  We will continue her other medications at their current dosages.  Antonieta Pert, MD 06/22/2017, 10:40 AM

## 2017-06-22 NOTE — [facility] Group Notes (Signed)
[facility] Group Notes:  (Nursing)  Date:  06/22/2017  Time:  1:15 PM Type of Therapy:  Nurse Education  Participation Level:  Did Not Attend  Participation Quality:  Did not attend  Affect:  Did not attend  Cognitive:  Did not attend  Insight:  None  Engagement in Group:  None  Modes of Intervention:  Did not attend  Summary of Progress/Problems:  Catherine Joyce 06/22/2017, 2:07 PM

## 2017-06-22 NOTE — [facility] Group Notes (Signed)
LCSW Group Therapy Note  06/22/2017    10:30-11:30am   Type of Therapy and Topic:  Group Therapy: Anger and Coping Skills  Participation Level:  Active   Description of Group:   In this group, patients learned how to recognize the physical, cognitive, emotional, and behavioral responses they have to anger-provoking situations.  They identified how they usually or often react when angered, and learned how healthy and unhealthy coping skills work initially, but the unhealthy ones stop working.   They analyzed how their frequently-chosen coping skill is possibly beneficial and how it is possibly unhelpful.  The group discussed a variety of healthier coping skills that could help in resolving the actual issues, as well as how to go about planning for the the possibility of future similar situations.  Therapeutic Goals: 1. Patients will identify one thing that makes them angry and how they feel emotionally and physically, what their thoughts are or tend to be in those situations, and what healthy or unhealthy coping mechanism they typically use 2. Patients will identify how their coping technique works for them, as well as how it works against them. 3. Patients will explore possible new behaviors to use in future anger situations. 4. Patients will learn that anger itself is normal and cannot be eliminated, and that healthier coping skills can assist with resolving conflict rather than worsening situations.  Summary of Patient Progress:  The patient shared that she often used to use drugs to deal with her emotions when angry, and has been sober for some time now.  She stated she now will go to an Alcoholics Anonymous meeting and share her feelings instead.  When her depressive symptoms returned, she was angry and tempted to relapse.  However, she instead started isolating, then sought the help she is now receiving in the hospital.  She was very active throughout group and gave feedback to other patients  from her own experience, very appropriate in what she shared and how she did so.  Therapeutic Modalities:   Cognitive Behavioral Therapy Motivation Interviewing  Lynnell Chad  .

## 2017-06-22 NOTE — Progress Notes (Signed)
Pt reports she is doing better and denies SI/HI/AVH.  She was observed sitting in the dayroom at the beginning of the shift watching TV and talking with peers.  She states she may be discharged tomorrow or Sunday because her wedding anniversary is Sunday.  She says the thing she is most anxious about is going back to work.  She says he job is very stressful and she doesn't feel supported at work.  Pt says she may look into changing her workplace, but says she will have to return to her present job until she finds another.  Support and encouragement offered.  Pt makes her needs known to staff.  Discharge plans are in process.  Safety maintained with q15 minute checks.

## 2017-06-23 MED ORDER — ESZOPICLONE 3 MG PO TABS: 3.0000 mg | ORAL_TABLET | Freq: Every day | ORAL | 0 refills | Status: DC

## 2017-06-23 MED ORDER — HYDROXYZINE HCL 50 MG PO TABS: 50.0000 mg | ORAL_TABLET | Freq: Every day | ORAL | 0 refills | Status: AC

## 2017-06-23 MED ORDER — LAMOTRIGINE 100 MG PO TABS: 100.0000 mg | ORAL_TABLET | Freq: Every day | ORAL | 0 refills | Status: DC

## 2017-06-23 MED ORDER — ESZOPICLONE 2 MG PO TABS: 3.0000 mg | ORAL_TABLET | Freq: Every day | ORAL | Status: DC

## 2017-06-23 MED ORDER — OXCARBAZEPINE 600 MG PO TABS: 600.0000 mg | ORAL_TABLET | Freq: Every day | ORAL | 0 refills | Status: DC

## 2017-06-23 MED ORDER — QUETIAPINE FUMARATE 50 MG PO TABS: 50.0000 mg | ORAL_TABLET | Freq: Every day | ORAL | 0 refills | Status: DC

## 2017-06-23 MED ORDER — NON FORMULARY: 3.0000 mg | Freq: Every day | Status: DC

## 2017-06-23 MED ORDER — PROPRANOLOL HCL 10 MG PO TABS: 10.0000 mg | ORAL_TABLET | Freq: Two times a day (BID) | ORAL | 0 refills | Status: DC | PRN

## 2017-06-23 MED ORDER — HYDROXYZINE HCL 10 MG PO TABS: 10.0000 mg | ORAL_TABLET | Freq: Four times a day (QID) | ORAL | 0 refills | Status: AC | PRN

## 2017-06-23 MED ORDER — VENLAFAXINE HCL ER 37.5 MG PO CP24: 112.5000 mg | ORAL_CAPSULE | Freq: Every day | ORAL | 0 refills | Status: DC

## 2017-06-23 MED ORDER — LAMOTRIGINE 200 MG PO TABS: 200.0000 mg | ORAL_TABLET | Freq: Every day | ORAL | 0 refills | Status: DC

## 2017-06-23 NOTE — Progress Notes (Signed)
  [facility] Discharge Plan :  Will you be returning to the same living situation after discharge:  Yes,  own home At discharge, do you have transportation home?: Yes,  husband Do you have the ability to pay for your medications: Yes,  [employer]  Release of information consent forms completed and in the chart;  Patient's signature needed at discharge.  Patient to Follow up at: Follow-up Information    [facility] Go on 06/27/2017.   Why:  Please attend your therapy appt with Lindon Romp on Thursday, 06/27/17, at 3:00pm.  Please attend your medication appt with Vikki Ports on Monday, 07/01/17, at 8:30am. Contact information: [address] [place] [address] [phone]           Next level of care provider has access to [facility] Link:no  Safety Planning and Suicide Prevention discussed: Yes,  with husband  Have you used any form of tobacco in the last 30 days? (Cigarettes, Smokeless Tobacco, Cigars, and/or Pipes): Yes  Has patient been referred to the Quitline?: Patient refused referral  Patient has been referred for addiction treatment: Pt. refused referral  Lorri Frederick, LCSW 06/23/2017, 9:16 AM

## 2017-06-23 NOTE — Progress Notes (Signed)
DAR Note: Pt observed in the dayroom interacting with peers. Pt who appeared to be anxious endorsed moderate anxiety and mild depression; "I will be going home tomorrow, I don't know how my colleagues at work will receive me." Pt denied SI/HI, AVH or pain. Pt contracts for safety. Medications offered as prescribed. All patient's questions and concerns addressed. Support, encouragement, and safe environment provided. 15-minute safety checks continue. Pt was med compliant. Safety checks continue.

## 2017-06-23 NOTE — Discharge Summary (Signed)
Physician Discharge Summary Note  Patient:  Catherine Joyce is an 32 y.o., female MRN:  [id number] DOB:  1985-10-25 Patient phone:  [phone] (home)  Patient address:   [place] [address],  Total Time spent with patient: 45 minutes  Date of Admission:  06/18/2017 Date of Discharge: 06/23/2017  Reason for Admission:  Suicide threat  Principal Problem: Bipolar 1 disorder, depressed, severe ([place]) Discharge Diagnoses: Patient Active Problem List   Diagnosis Date Noted  . Bipolar 1 disorder, depressed, severe ([place]) [F31.4] 06/18/2017    Priority: High  . Elevated liver enzymes [R74.8] 04/26/2016  . GAD (generalized anxiety disorder) [F41.1] 01/25/2016  . VULVITIS [N76.0] 06/21/2009  . OTH D/O MENSTRUATION&OTH ABN BLEED FE GNT TRACT [N94.9] 06/21/2009  . NAUSEA AND VOMITING [R11.2] 04/11/2009  . ABDOMINAL PAIN RIGHT LOWER QUADRANT [R10.31] 04/11/2009  . LARYNGOTRACHEOBRONCHITIS, ACUTE [J05.0] 02/17/2009  . ADVERSE REACTION TO MEDICATION [T50.995A] 12/13/2008  . URI [J06.9] 12/06/2008  . DYSURIA [R30.0] 12/06/2008  . BACK STRAIN, ACUTE [IMO0002] 11/22/2008  . NECK PAIN, LEFT [M54.2] 11/01/2008  . HEADACHE [R51] 11/01/2008  . SORE THROAT [J02.9] 04/16/2008  . 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 ALLERGIC RHINITIS [J30.9] 11/06/2006  . ACNE, MILD [L70.8] 11/06/2006  . Abdominal pain, unspecified site [R10.9] 11/06/2006    Past Psychiatric History: bipolar disorder, insomnia, anxiety, substance abuse  Past Medical History:  Past Medical History:  Diagnosis Date  . Depression   . Drug addict ([place])   . Migraine    History reviewed. No pertinent surgical history. Family History: History reviewed. No pertinent family history. Family Psychiatric  History: see above Social History:  Social History   Substance and Sexual Activity  Alcohol Use No     Social History   Substance and Sexual Activity  Drug Use Not Currently    Social History    Socioeconomic History  . Marital status: Married    Spouse name: Not on file  . Number of children: Not on file  . Years of education: Not on file  . Highest education level: Not on file  Occupational History  . Not on file  Social Needs  . Financial resource strain: Not on file  . Food insecurity:    Worry: Not on file    Inability: Not on file  . Transportation needs:    Medical: Not on file    Non-medical: Not on file  Tobacco Use  . Smoking status: Current Some Day Smoker    Types: Cigarettes  . Smokeless tobacco: Current User  . Tobacco comment: Wearing patch from treatment 14/mg  Substance and Sexual Activity  . Alcohol use: No  . Drug use: Not Currently  . Sexual activity: Not on file  Lifestyle  . Physical activity:    Days per week: Not on file    Minutes per session: Not on file  . Stress: Not on file  Relationships  . Social connections:    Talks on phone: Not on file    Gets together: Not on file    Attends religious service: Not on file    Active member of club or organization: Not on file    Attends meetings of clubs or organizations: Not on file    Relationship status: Not on file  Other Topics Concern  . Not on file  Social History Narrative  . Not on file    Hospital Course:  On admission 06/19/2017:  Today, Patient was seen and examined by psychiatrist and these  are his findings. Patient is a 32 year old female with a reported past psychiatric history significant for bipolar type II disorder who presented to the [facility] long emergency department with suicidal ideation. The patient has a complicated history of pharmacological management. She was originally diagnosed with depression several years ago following opiate substance issues. She was started on mood stabilizers and antidepressants at that time. She was seen in the intensive outpatient program at [facility] after that, and there was some speculation that her depression was more related to  bipolar 2 disorder. She was started on mood stabilizers at that time. She has been followed in an outpatient clinic by Mallie Snooks, MD. The patient's history is a little bit more complicated, and that he was giving her samples of Vraylar. Initially she stated that Vraylarwas helpful, and she self stopped it because she and her husband were thinking about having another child. She went to get off her medications. Her mood and other situations worsened after that. Dr. Owens Shark was recently hospitalized, and she was unable to get into see him. He had been giving her samples, and very large is extremely expensive. She went back to his office and saw a nurse practitioner there 2 days ago. At that time she was prescribed Effexor XR as well as Latuda. On the day prior to admission she stated she was thinking about how she could use a knife to kill herself. She sees a knife in her head and thinks about cutting herself. She sees pills and thinks about overdosing. She was admitted to the hospital for evaluation and stabilization.  9/10 depression, denies suicidal ideations but "I wish I was not here (on earth)", anxiety "pretty bad, appetite "not very good", resting in her room on her bed, self isolating.  Medications:  Restarted Lamictal 100 mg in the am and 200 mg in the pm for mood stabilization, Trileptal 450 mg in the pm for mood and sleep, Effexor 75 mg daily for depression, Propranolol 10 mg BID PRN anxiety, Vistaril 25 mg every six hours PRN anxiety, Trazodone 50 mg at bedtime PRN sleep, and Vistaril 50 mg at bedtime PRN anxiety and sleep  06/20/2017:  Patient is seen and examined.  Patient is a 33 year old female with a past psychiatric history significant for depression versus bipolar disorder most recently depressed.  She is seen in follow-up.  She feels little bit better today.  She stated she thinks that the [place] being stopped helped.  We spent a great deal of time today going over historical  timeframe of her medications.  It seems as though a lot of her medicines have been added to deal with side effects from medications, and thinks it just continued to escalate.  She did not sleep well last night, and we discussed increasing her Trileptal up to 600 mg.  She believes that is what she was taking at home  Medications:  Increased Trileptal from 450 mg at bedtime to 600 mg for mood and sleep and increased her Effexor to 112.5 mg daily for depression and anxiety  06/21/2017:  Patient is seen and examined.  Patient is a 32 year old female with past psychiatric history significant for major depression, and previous concern for either bipolar disorder type I or type II.  She is seen in follow-up.  Her mood remains all right.  She still having depressive symptoms, but feels better than she did on admission.  I suspect that much of that was because of the [place].  Her anxiety is stable,  but she still not sleeping well.  The Trileptal at 600 mg really did not put a dent in that.  We discussed options today.  We had previously discussed using doxepin as an option.  We discussed how I feel like consolidation of her medicines is in her best interest.  She denied any suicidal ideation today  Medications:  Discontinued the Trazodone for sleep and started Doxepin 50 mg at bedtime for sleep  06/22/2017:   Patient is seen and examined.  Patient is a 32 year old female with a past psychiatric history significant for major depression, recurrent, severe without psychotic features versus bipolar disorder type I, most recently depressed, severe versus bipolar disorder type II with depression.  She is seen in follow-up.  The doxepin was not successful last night.  She had to take 75 and she really got very little sleep.  We discussed options.  Given her substance history I am very reluctant to give her any of the Z drugs for insomnia, and again we discussed the possibility of Lunesta.  She had taken that previously.  I  told her my feeling was that the [facility] with eventually become ineffective and she be back to square one.  She also admitted she had previously taken desipramine.  We discussed the possibility of adding that at bedtime.  Her mood continues to improve.  She is mildly anxious.  She denied any side effects to her current medications outside of feeling lethargic because the lack of sleep and doxepin, and no suicidal ideation.  Medications:  Started Lunesta 3 mg at bedtime for sleep, discontinued the Doxepine  06/23/2017:  Patient has met maximum benefit from hospitalization.  No suicidal/homicidal ideations, hallucinations, or substance abuse issues.  Discharge instructions provided with explanation along with 24 hour crisis numbers, Rx, and follow-up appointments provided.  Stable for discharge.  Physical Findings: AIMS: Facial and Oral Movements Muscles of Facial Expression: None, normal Lips and Perioral Area: None, normal Jaw: None, normal Tongue: None, normal,Extremity Movements Upper (arms, wrists, hands, fingers): None, normal Lower (legs, knees, ankles, toes): None, normal, Trunk Movements Neck, shoulders, hips: None, normal, Overall Severity Severity of abnormal movements (highest score from questions above): None, normal Incapacitation due to abnormal movements: None, normal Patient's awareness of abnormal movements (rate only patient's report): No Awareness, Dental Status Current problems with teeth and/or dentures?: No Does patient usually wear dentures?: No  CIWA:    COWS:    Musculoskeletal: Strength & Muscle Tone: within normal limits Gait & Station: normal Patient leans: N/A  Psychiatric Specialty Exam: Review of Systems  All other systems reviewed and are negative.   Blood pressure 104/79, pulse (!) 107, temperature 98.4 F (36.9 C), temperature source Oral, resp. rate 20, height '5\' 5"'$  (1.651 m), weight 66.2 kg (146 lb), SpO2 99 %.Body mass index is 24.3 kg/m.   General Appearance: Casual  Eye Contact::  Fair  Speech:  Normal Rate409  Volume:  Normal  Mood:  Euthymic  Affect:  Appropriate  Thought Process:  Coherent  Orientation:  Full (Time, Place, and Person)  Thought Content:  Logical  Suicidal Thoughts:  No  Homicidal Thoughts:  No  Memory:  Immediate;   Fair Recent;   Fair Remote;   Fair  Judgement:  Good  Insight:  Good  Psychomotor Activity:  Normal  Concentration:  Good  Recall:  Good  Fund of Knowledge:Good  Language: Good  Akathisia:  Negative  Handed:  Right  AIMS (if indicated):     Assets:  Communication Skills Desire for Improvement Financial Resources/Insurance Housing Intimacy Physical Health Resilience  Sleep:  Number of Hours: 6.5  Cognition: WNL  ADL's:  Intact    Have you used any form of tobacco in the last 30 days? (Cigarettes, Smokeless Tobacco, Cigars, and/or Pipes): Yes  Has this patient used any form of tobacco in the last 30 days? (Cigarettes, Smokeless Tobacco, Cigars, and/or Pipes) Yes, Yes, A prescription for an FDA-approved tobacco cessation medication was offered at discharge and the patient refused  Blood Alcohol level:  Lab Results  Component Value Date   ETH <10 73/71/0626    Metabolic Disorder Labs:  No results found for: HGBA1C, MPG No results found for: PROLACTIN No results found for: CHOL, TRIG, HDL, CHOLHDL, VLDL, LDLCALC  See Psychiatric Specialty Exam and Suicide Risk Assessment completed by Attending Physician prior to discharge.  Discharge destination:  Home  Is patient on multiple antipsychotic therapies at discharge:  No   Has Patient had three or more failed trials of antipsychotic monotherapy by history:  No  Recommended Plan for Multiple Antipsychotic Therapies: NA  Discharge Instructions    Diet - low sodium heart healthy   Complete by:  As directed    Discharge instructions   Complete by:  As directed    Follow-up with outpatient provider   Increase  activity slowly   Complete by:  As directed      Allergies as of 06/23/2017      Reactions   Sulfonamide Derivatives       Medication List    STOP taking these medications   buPROPion 300 MG 24 hr tablet Commonly known as:  WELLBUTRIN XL   cloNIDine 0.2 MG tablet Commonly known as:  CATAPRES   gabapentin 300 MG capsule Commonly known as:  NEURONTIN   LATUDA 20 MG Tabs tablet Generic drug:  lurasidone   LORazepam 0.5 MG tablet Commonly known as:  ATIVAN   naltrexone 50 MG tablet Commonly known as:  DEPADE   nicotine 7 mg/24hr patch Commonly known as:  NICODERM CQ   sertraline 100 MG tablet Commonly known as:  ZOLOFT   traZODone 100 MG tablet Commonly known as:  DESYREL   venlafaxine 75 MG tablet Commonly known as:  EFFEXOR Replaced by:  venlafaxine XR 37.5 MG 24 hr capsule   VRAYLAR capsule Generic drug:  cariprazine     TAKE these medications     Indication  Eszopiclone 3 MG Tabs Take 1 tablet (3 mg total) by mouth at bedtime. Take immediately before bedtime  Indication:  Trouble Sleeping   hydrOXYzine 10 MG tablet Commonly known as:  ATARAX/VISTARIL Take 1 tablet (10 mg total) by mouth every 6 (six) hours as needed for anxiety.  Indication:  Feeling Anxious   hydrOXYzine 50 MG tablet Commonly known as:  ATARAX/VISTARIL Take 1 tablet (50 mg total) by mouth at bedtime.  Indication:  Feeling Anxious   lamoTRIgine 200 MG tablet Commonly known as:  LAMICTAL Take 1 tablet (200 mg total) by mouth at bedtime. What changed:  when to take this  Indication:  Depression   oxcarbazepine 600 MG tablet Commonly known as:  TRILEPTAL Take 1 tablet (600 mg total) by mouth at bedtime.  Indication:  mood stabilization   propranolol 10 MG tablet Commonly known as:  INDERAL Take 1 tablet (10 mg total) by mouth 2 (two) times daily as needed (anxiety). What changed:    how much to take  how to take this  when to take  this  reasons to take  this  additional instructions  Indication:  Anxiety Related to Current Life Problems   QUEtiapine 50 MG tablet Commonly known as:  SEROQUEL Take 1 tablet (50 mg total) by mouth at bedtime.  Indication:  Depressive Phase of Manic-Depression   spironolactone 50 MG tablet Commonly known as:  ALDACTONE Take 50 mg by mouth daily.  Indication:  Edema   venlafaxine XR 37.5 MG 24 hr capsule Commonly known as:  EFFEXOR-XR Take 3 capsules (112.5 mg total) by mouth daily with breakfast. Start taking on:  06/24/2017 Replaces:  venlafaxine 75 MG tablet  Indication:  Generalized Anxiety Disorder, Major Depressive Disorder      Follow-up [facility] on 06/27/2017.   Why:  Please attend your therapy appt with Chase Picket on Thursday, 06/27/17, at 3:00pm.  Please attend your medication appt with Mateo Flow on Monday, 07/01/17, at 8:30am. Contact information: [place] [address] [phone]           Follow-up recommendations:  Activity:  as tolerated Diet:  heart healthy diet  Comments:  Follow-up with outpatient provider after discharge  Signed: Waylan Boga, NP 06/23/2017, 8:38 AM

## 2017-06-23 NOTE — Progress Notes (Signed)
Patient ID: Catherine Joyce, female   DOB: 06-24-1985, 31 y.o.   MRN: [id number]  Nursing Progress Note [id number]  Data: Patient presents with bright affect, pleasant mood and is mildly anxious. Patient complaint with scheduled medications. Patient denies SI/HI/AVH or pain. Patient contracts for safety on the unit at this time. Patient completed self-inventory sheet and rates depression, hopelessness, and anxiety 3,4,7 respectively. Patient rates their sleep and appetite as fair/good respectively. Patient states goal for today is to "finalize my discharge plan" and "go home". Patient reports she is "nervous to go back to work". Patient is visible up in the milieu.  Action: Patient educated about and provided medication per provider's orders. Patient safety maintained with q15 min safety checks. Low fall risk precautions in place. Emotional support given. 1:1 interaction and active listening provided. Patient encouraged to attend meals and groups. Labs, vital signs and patient behavior monitored throughout shift. Patient encouraged to work on treatment plan and goals.  Response: Patient remains safe on the unit at this time. Patient is interacting with peers appropriately on the unit. Will continue to support and monitor.

## 2017-06-23 NOTE — BHH Suicide Risk Assessment (Signed)
[facility] Discharge Suicide Risk Assessment   Principal Problem: Bipolar 1 disorder, depressed, severe ([facility]) Discharge Diagnoses:  Patient Active Problem List   Diagnosis Date Noted  . Bipolar 1 disorder, depressed, severe ([facility]) [F31.4] 06/18/2017  . Elevated liver enzymes [R74.8] 04/26/2016  . GAD (generalized anxiety disorder) [F41.1] 01/25/2016  . VULVITIS [N76.0] 06/21/2009  . OTH D/O MENSTRUATION&OTH ABN BLEED FE GNT TRACT [N94.9] 06/21/2009  . NAUSEA AND VOMITING [R11.2] 04/11/2009  . ABDOMINAL PAIN RIGHT LOWER QUADRANT [R10.31] 04/11/2009  . LARYNGOTRACHEOBRONCHITIS, ACUTE [J05.0] 02/17/2009  . ADVERSE REACTION TO MEDICATION [T50.995A] 12/13/2008  . URI [J06.9] 12/06/2008  . DYSURIA [R30.0] 12/06/2008  . BACK STRAIN, ACUTE [IMO0002] 11/22/2008  . NECK PAIN, LEFT [M54.2] 11/01/2008  . HEADACHE [R51] 11/01/2008  . SORE THROAT [J02.9] 04/16/2008  . 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 ALLERGIC RHINITIS [J30.9] 11/06/2006  . ACNE, MILD [L70.8] 11/06/2006  . Abdominal pain, unspecified site [R10.9] 11/06/2006    Total Time spent with patient: 30 minutes  Musculoskeletal: Strength & Muscle Tone: within normal limits Gait & Station: normal Patient leans: N/A  Psychiatric Specialty Exam: Review of Systems  All other systems reviewed and are negative.   Blood pressure 104/79, pulse (!) 107, temperature 98.4 F (36.9 C), temperature source Oral, resp. rate 20, height  (1.651 m), weight 66.2 kg (146 lb), SpO2 99 %.Body mass index is 24.3 kg/m.  General Appearance: Casual  Eye Contact::  Fair  Speech:  Normal Rate409  Volume:  Normal  Mood:  Euthymic  Affect:  Appropriate  Thought Process:  Coherent  Orientation:  Full (Time, Place, and Person)  Thought Content:  Logical  Suicidal Thoughts:  No  Homicidal Thoughts:  No  Memory:  Immediate;   Fair Recent;   Fair Remote;   Fair  Judgement:  Good  Insight:  Good  Psychomotor Activity:  Normal  Concentration:   Good  Recall:  Good  Fund of Knowledge:Good  Language: Good  Akathisia:  Negative  Handed:  Right  AIMS (if indicated):     Assets:  Communication Skills Desire for Improvement Financial Resources/Insurance Housing Intimacy Physical Health Resilience  Sleep:  Number of Hours: 6.5  Cognition: WNL  ADL's:  Intact   Mental Status Per Nursing Assessment::   On Admission:  Suicidal ideation indicated by patient  Demographic Factors:  Caucasian  Loss Factors: NA  Historical Factors: Impulsivity  Risk Reduction Factors:   Responsible for children under 71 years of age, Sense of responsibility to family, Living with another person, especially a relative and Positive social support  Continued Clinical Symptoms:  Depression:   Insomnia  Cognitive Features That Contribute To Risk:  None    Suicide Risk:  Minimal: No identifiable suicidal ideation.  Patients presenting with no risk factors but with morbid ruminations; may be classified as minimal risk based on the severity of the depressive symptoms  Follow-up Information    [facility] Go on 06/27/2017.   Why:  Please attend your therapy appt with Lindon Romp on Thursday, 06/27/17, at 3:00pm.  Please attend your medication appt with Vikki Ports on Monday, 07/01/17, at 8:30am. Contact information: [address] [place] [address] [phone]           Plan Of Care/Follow-up recommendations:  Activity:  ad lib Diet:  regular  Antonieta Pert, MD 06/23/2017, 7:59 AM

## 2017-06-23 NOTE — Progress Notes (Signed)
Patient ID: Catherine Joyce, female   DOB: 14-Oct-1985, 32 y.o.   MRN: [id number]  Discharge Note  D) Patient discharged to lobby. Patient states readiness for discharge. Patient denies SI/HI, AVH and is not delusional or psychotic. Patient in no acute distress. Patient has completed their Suicide Safety Plan. Patient provided an opportunity to complete and return Patient Satisfaction Survey. Patient agrees to the discharge plan.  A) Written and verbal discharge instructions given to the patient. Patient accepting to information and verbalized understanding. Patient agrees to the discharge plan. Opportunity for questions and concerns presented to patient. Patient denied any further questions or concerns. All belongings returned to patient. Patient signed for return of belongings and discharge paperwork. Patient provided a copy of their Suicide Safety Plan and has been provided a copy of the Patient Satisfaction Survey with return instructions.  R) Patient safely escorted to the lobby. Patient discharged from [facility] with a note for work, prescriptions, personal belongings, follow-up appointment in place and discharge paperwork.

## 2017-07-02 DIAGNOSIS — L700 Acne vulgaris: Secondary | ICD-10-CM | POA: Diagnosis not present

## 2017-10-01 DIAGNOSIS — R601 Generalized edema: Secondary | ICD-10-CM | POA: Diagnosis not present

## 2017-10-23 DIAGNOSIS — R635 Abnormal weight gain: Secondary | ICD-10-CM | POA: Diagnosis not present

## 2017-10-23 DIAGNOSIS — R600 Localized edema: Secondary | ICD-10-CM | POA: Diagnosis not present

## 2017-10-31 ENCOUNTER — Other Ambulatory Visit (HOSPITAL_COMMUNITY): Payer: Self-pay | Admitting: Family Medicine

## 2017-10-31 DIAGNOSIS — R600 Localized edema: Secondary | ICD-10-CM

## 2017-11-06 ENCOUNTER — Ambulatory Visit (HOSPITAL_COMMUNITY): Payer: 59 | Attending: Cardiovascular Disease

## 2017-11-06 ENCOUNTER — Other Ambulatory Visit: Payer: Self-pay

## 2017-11-06 DIAGNOSIS — R600 Localized edema: Secondary | ICD-10-CM | POA: Insufficient documentation

## 2017-11-14 DIAGNOSIS — R740 Nonspecific elevation of levels of transaminase and lactic acid dehydrogenase [LDH]: Secondary | ICD-10-CM | POA: Diagnosis not present

## 2017-11-14 DIAGNOSIS — K222 Esophageal obstruction: Secondary | ICD-10-CM | POA: Diagnosis not present

## 2017-11-14 DIAGNOSIS — R600 Localized edema: Secondary | ICD-10-CM | POA: Diagnosis not present

## 2017-11-19 ENCOUNTER — Other Ambulatory Visit: Payer: Self-pay | Admitting: Family Medicine

## 2017-11-19 DIAGNOSIS — R7401 Elevation of levels of liver transaminase levels: Secondary | ICD-10-CM

## 2017-11-19 DIAGNOSIS — R740 Nonspecific elevation of levels of transaminase and lactic acid dehydrogenase [LDH]: Principal | ICD-10-CM

## 2017-11-19 DIAGNOSIS — R21 Rash and other nonspecific skin eruption: Secondary | ICD-10-CM | POA: Diagnosis not present

## 2017-11-22 ENCOUNTER — Other Ambulatory Visit: Payer: Self-pay | Admitting: *Deleted

## 2017-11-22 DIAGNOSIS — M7989 Other specified soft tissue disorders: Secondary | ICD-10-CM

## 2017-11-29 ENCOUNTER — Ambulatory Visit
Admission: RE | Admit: 2017-11-29 | Discharge: 2017-11-29 | Disposition: A | Discharge status: Home / Self Care | Payer: 59 | Source: Ambulatory Visit | Attending: Family Medicine | Admitting: Family Medicine

## 2017-11-29 DIAGNOSIS — R748 Abnormal levels of other serum enzymes: Secondary | ICD-10-CM | POA: Diagnosis not present

## 2017-11-29 DIAGNOSIS — R740 Nonspecific elevation of levels of transaminase and lactic acid dehydrogenase [LDH]: Principal | ICD-10-CM

## 2017-11-29 DIAGNOSIS — R7401 Elevation of levels of liver transaminase levels: Secondary | ICD-10-CM

## 2017-12-04 DIAGNOSIS — Z23 Encounter for immunization: Secondary | ICD-10-CM | POA: Diagnosis not present

## 2017-12-04 DIAGNOSIS — L700 Acne vulgaris: Secondary | ICD-10-CM | POA: Diagnosis not present

## 2017-12-04 DIAGNOSIS — Z79899 Other long term (current) drug therapy: Secondary | ICD-10-CM | POA: Diagnosis not present

## 2017-12-09 DIAGNOSIS — Z0000 Encounter for general adult medical examination without abnormal findings: Secondary | ICD-10-CM | POA: Diagnosis not present

## 2017-12-09 DIAGNOSIS — Z13220 Encounter for screening for lipoid disorders: Secondary | ICD-10-CM | POA: Diagnosis not present

## 2017-12-10 DIAGNOSIS — T8332XA Displacement of intrauterine contraceptive device, initial encounter: Secondary | ICD-10-CM | POA: Diagnosis not present

## 2017-12-10 DIAGNOSIS — N760 Acute vaginitis: Secondary | ICD-10-CM | POA: Diagnosis not present

## 2017-12-10 DIAGNOSIS — Z01411 Encounter for gynecological examination (general) (routine) with abnormal findings: Secondary | ICD-10-CM | POA: Diagnosis not present

## 2017-12-20 DIAGNOSIS — Z30431 Encounter for routine checking of intrauterine contraceptive device: Secondary | ICD-10-CM | POA: Diagnosis not present

## 2017-12-30 ENCOUNTER — Other Ambulatory Visit: Payer: Self-pay

## 2017-12-30 ENCOUNTER — Encounter: Payer: Self-pay | Admitting: Surgery

## 2017-12-30 ENCOUNTER — Ambulatory Visit (HOSPITAL_COMMUNITY)
Admission: RE | Admit: 2017-12-30 | Discharge: 2017-12-30 | Disposition: A | Discharge status: Home / Self Care | Payer: 59 | Source: Ambulatory Visit | Attending: Surgery | Admitting: Surgery

## 2017-12-30 ENCOUNTER — Ambulatory Visit (INDEPENDENT_AMBULATORY_CARE_PROVIDER_SITE_OTHER): Payer: 59 | Admitting: Surgery

## 2017-12-30 VITALS — BP 120/79 | HR 83 | Temp 95.6°F | Resp 16 | Ht 65.0 in | Wt 170.0 lb

## 2017-12-30 DIAGNOSIS — M7989 Other specified soft tissue disorders: Secondary | ICD-10-CM | POA: Insufficient documentation

## 2017-12-30 NOTE — Progress Notes (Signed)
[facility]  Patient name: Catherine Joyce MRN: [id number] DOB: 1985-08-18 Sex: female   REQUESTING PROVIDER:    Dr. Susa Simmonds   REASON FOR CONSULT:    Leg swelling  HISTORY OF PRESENT ILLNESS:   Catherine Joyce is a 32 y.o. female, who is referred for evaluation of bilateral lower extremity edema.  She states that this started back in August.  The edema goes up to the mid calf.  She has trouble putting shoes on.  Her calves get very tight and hard and can be painful.  She is tried to wear over-the-counter compression stockings which has provided some relief.  She thinks that it has gotten significantly better as of late.  She has had a cardiac and renal work-up which were negative for etiology of her swelling.  The patient has medication refractory depression.  She has a history of drug abuse and migraine headaches.  Her father had a DVT recently.  There is no other history of hypercoagulable disorders.  She is a former smoker  PAST MEDICAL HISTORY    Past Medical History:  Diagnosis Date  . Depression   . Drug addict (HCC)   . Migraine      FAMILY HISTORY   Positive for DVT in her father  SOCIAL HISTORY:   Social History   Socioeconomic History  . Marital status: Married    Spouse name: Not on file  . Number of children: Not on file  . Years of education: Not on file  . Highest education level: Not on file  Occupational History  . Not on file  Social Needs  . Financial resource strain: Not on file  . Food insecurity:    Worry: Not on file    Inability: Not on file  . Transportation needs:    Medical: Not on file    Non-medical: Not on file  Tobacco Use  . Smoking status: Former Smoker    Types: Cigarettes    Last attempt to quit: 2017    Years since quitting: 2.9  . Smokeless tobacco: Never Used  Substance and Sexual Activity  . Alcohol use: No  . Drug use: Not Currently  . Sexual activity: Not on  file  Lifestyle  . Physical activity:    Days per week: Not on file    Minutes per session: Not on file  . Stress: Not on file  Relationships  . Social connections:    Talks on phone: Not on file    Gets together: Not on file    Attends religious service: Not on file    Active member of club or organization: Not on file    Attends meetings of clubs or organizations: Not on file    Relationship status: Not on file  . Intimate partner violence:    Fear of current or ex partner: Not on file    Emotionally abused: Not on file    Physically abused: Not on file    Forced sexual activity: Not on file  Other Topics Concern  . Not on file  Social History Narrative  . Not on file    ALLERGIES:    Allergies  Allergen Reactions  . Sulfonamide Derivatives     CURRENT MEDICATIONS:    Current Outpatient Medications  Medication Sig Dispense Refill  . buPROPion (WELLBUTRIN XL) 150 MG 24 hr tablet Take 300 mg by mouth every morning.  1  . clonazePAM (KLONOPIN) 1 MG tablet TAKE 1 TABLET BY  MOUTH EVERY DAY (fill ONLY every 10 DAYS)  2  . cloNIDine (CATAPRES) 0.1 MG tablet Take 0.2 mg by mouth at bedtime.  3  . hydrOXYzine (ATARAX/VISTARIL) 10 MG tablet Take 1 tablet (10 mg total) by mouth every 6 (six) hours as needed for anxiety. 60 tablet 0  . hydrOXYzine (ATARAX/VISTARIL) 50 MG tablet Take 1 tablet (50 mg total) by mouth at bedtime. 30 tablet 0  . lamoTRIgine (LAMICTAL) 200 MG tablet Take 200 mg by mouth 3 (three) times daily.    Marland Kitchen spironolactone (ALDACTONE) 50 MG tablet Take 50 mg by mouth daily.    . ZUBSOLV 5.7-1.4 MG SUBL DISSOLVE ONE TABLET BY MOUTH EVERY 8 HOURS  0   No current facility-administered medications for this visit.     REVIEW OF SYSTEMS:   [X]  denotes positive finding, [ ]  denotes negative finding Cardiac  Comments:  Chest pain or chest pressure:    Shortness of breath upon exertion:    Short of breath when lying flat:    Irregular heart rhythm:          Vascular    Pain in calf, thigh, or hip brought on by ambulation:    Pain in feet at night that wakes you up from your sleep:     Blood clot in your veins:    Leg swelling:  x       Pulmonary    Oxygen at home:    Productive cough:     Wheezing:         Neurologic    Sudden weakness in arms or legs:     Sudden numbness in arms or legs:     Sudden onset of difficulty speaking or slurred speech:    Temporary loss of vision in one eye:     Problems with dizziness:         Gastrointestinal    Blood in stool:      Vomited blood:         Genitourinary    Burning when urinating:     Blood in urine:        Psychiatric    Major depression:         Hematologic    Bleeding problems:    Problems with blood clotting too easily:        Skin    Rashes or ulcers:        Constitutional    Fever or chills:     PHYSICAL EXAM:   Vitals:   12/30/17 1324  BP: 120/79  Pulse: 83  Resp: 16  Temp: (!) 95.6 F (35.3 C)  TempSrc: Oral  SpO2: 100%  Weight: 170 lb (77.1 kg)  Height: 5\' 5"  (1.651 m)    GENERAL: The patient is a well-nourished female, in no acute distress. The vital signs are documented above. CARDIAC: There is a regular rate and rhythm.  VASCULAR: Trace edema bilaterally PULMONARY: Nonlabored respirations MUSCULOSKELETAL: There are no major deformities or cyanosis. NEUROLOGIC: No focal weakness or paresthesias are detected. SKIN: There are no ulcers or rashes noted. PSYCHIATRIC: The patient has a normal affect.  STUDIES:   I have ordered and reviewed the vascular lab studies with the following findings:    Venous Reflux Times Normal value < 0.5 sec +------------------------------+----------+---------+                Right (ms)Left (ms) +------------------------------+----------+---------+ CFV              1254.30  1511.00  +------------------------------+----------+---------+ GSV at Saphenofemoral junction6462.00         +------------------------------+----------+---------+ GSV prox thigh        3542.70        +------------------------------+----------+---------+  Vein Diameters: +------------------------------+----------+---------+                Right (cm)Left (cm) +------------------------------+----------+---------+ GSV at Saphenofemoral junction.63    .72    +------------------------------+----------+---------+ GSV at prox thigh       .64    .23    +------------------------------+----------+---------+ GSV at mid thigh       .29    .33 / .21 +------------------------------+----------+---------+ GSV at distal thigh      .22    .26 / .20 +------------------------------+----------+---------+ GSV at knee          .18    .18    +------------------------------+----------+---------+ GSV prox calf         too small nv     +------------------------------+----------+---------+ GSV mid calf         .16    nv     +------------------------------+----------+---------+ SSV origin          .24    .51    +------------------------------+----------+---------+ SSV prox           .13    .19    +------------------------------+----------+---------+ SSV mid            .13    .21    +------------------------------+----------+---------+     Summary: Right: Abnormal reflux times were noted in the common femoral vein, great saphenous vein at the saphenofemoral junction, and great saphenous vein at the proximal thigh. No DVT from the common femoral vein to the popliteal vein. Left: Abnormal reflux times were noted in the common femoral vein. Unable to demonstrate reflux in the greater saphenous or small saphenous vein. No DTV from the common femoral vein to the popliteal vein.  ASSESSMENT and PLAN    Bilateral leg swelling: Based off her venous reflux examination today, I do not feel that venous pathology is the etiology of her swelling.  I have encouraged her to keep her legs elevated when possible and to wear 20-30 compression.  Fortunately, her symptoms appear to be resolving.  She will contact me if she has any further questions, otherwise she will follow-up on an as-needed basis.   Durene CalWells , MD [facility] Tel [phone] Pager [phone]

## 2018-01-01 ENCOUNTER — Encounter: Payer: Self-pay | Admitting: Family Medicine

## 2018-01-02 DIAGNOSIS — R740 Nonspecific elevation of levels of transaminase and lactic acid dehydrogenase [LDH]: Secondary | ICD-10-CM | POA: Diagnosis not present

## 2018-01-02 DIAGNOSIS — R945 Abnormal results of liver function studies: Secondary | ICD-10-CM | POA: Diagnosis not present

## 2018-01-02 DIAGNOSIS — K5909 Other constipation: Secondary | ICD-10-CM | POA: Diagnosis not present

## 2018-01-02 DIAGNOSIS — R1310 Dysphagia, unspecified: Secondary | ICD-10-CM | POA: Diagnosis not present

## 2018-01-07 DIAGNOSIS — K209 Esophagitis, unspecified: Secondary | ICD-10-CM | POA: Diagnosis not present

## 2018-01-07 DIAGNOSIS — K2930 Chronic superficial gastritis without bleeding: Secondary | ICD-10-CM | POA: Diagnosis not present

## 2018-01-07 DIAGNOSIS — R1314 Dysphagia, pharyngoesophageal phase: Secondary | ICD-10-CM | POA: Diagnosis not present

## 2018-01-07 DIAGNOSIS — K317 Polyp of stomach and duodenum: Secondary | ICD-10-CM | POA: Diagnosis not present

## 2018-01-13 ENCOUNTER — Encounter: Payer: Self-pay | Admitting: Surgery

## 2018-01-13 ENCOUNTER — Encounter (HOSPITAL_COMMUNITY): Payer: Self-pay

## 2018-01-13 ENCOUNTER — Encounter

## 2018-02-20 DIAGNOSIS — Z5181 Encounter for therapeutic drug level monitoring: Secondary | ICD-10-CM | POA: Diagnosis not present

## 2018-02-20 DIAGNOSIS — L700 Acne vulgaris: Secondary | ICD-10-CM | POA: Diagnosis not present

## 2018-03-25 DIAGNOSIS — N898 Other specified noninflammatory disorders of vagina: Secondary | ICD-10-CM | POA: Diagnosis not present

## 2018-04-14 IMAGING — DX DG CHEST 2V
2 series · 2 of 2 positions shown · non-contrast
Comparison: 02/23/2009

CLINICAL DATA: Anxiety

EXAM:
CHEST  2 VIEW

[chest pa]
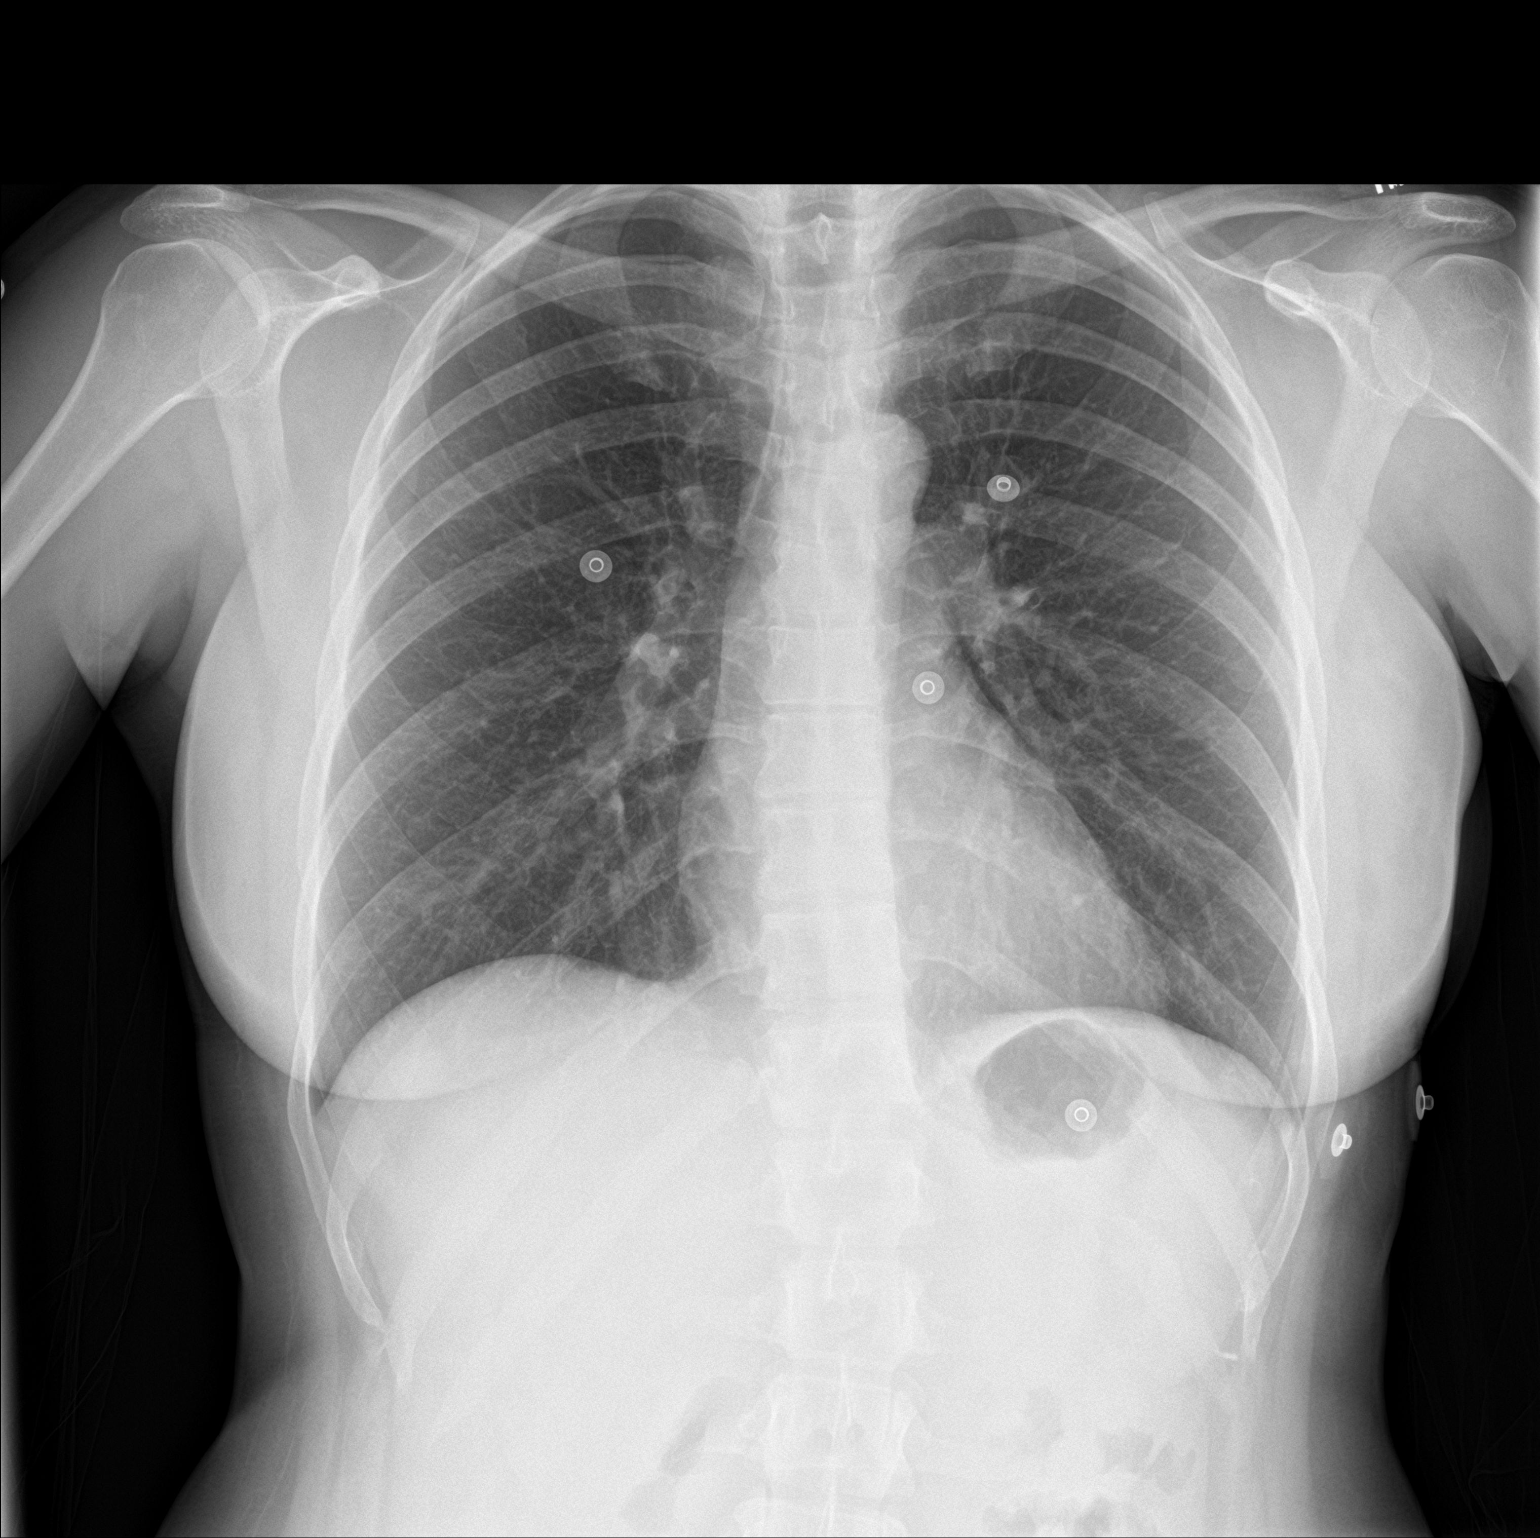

[chest lat]
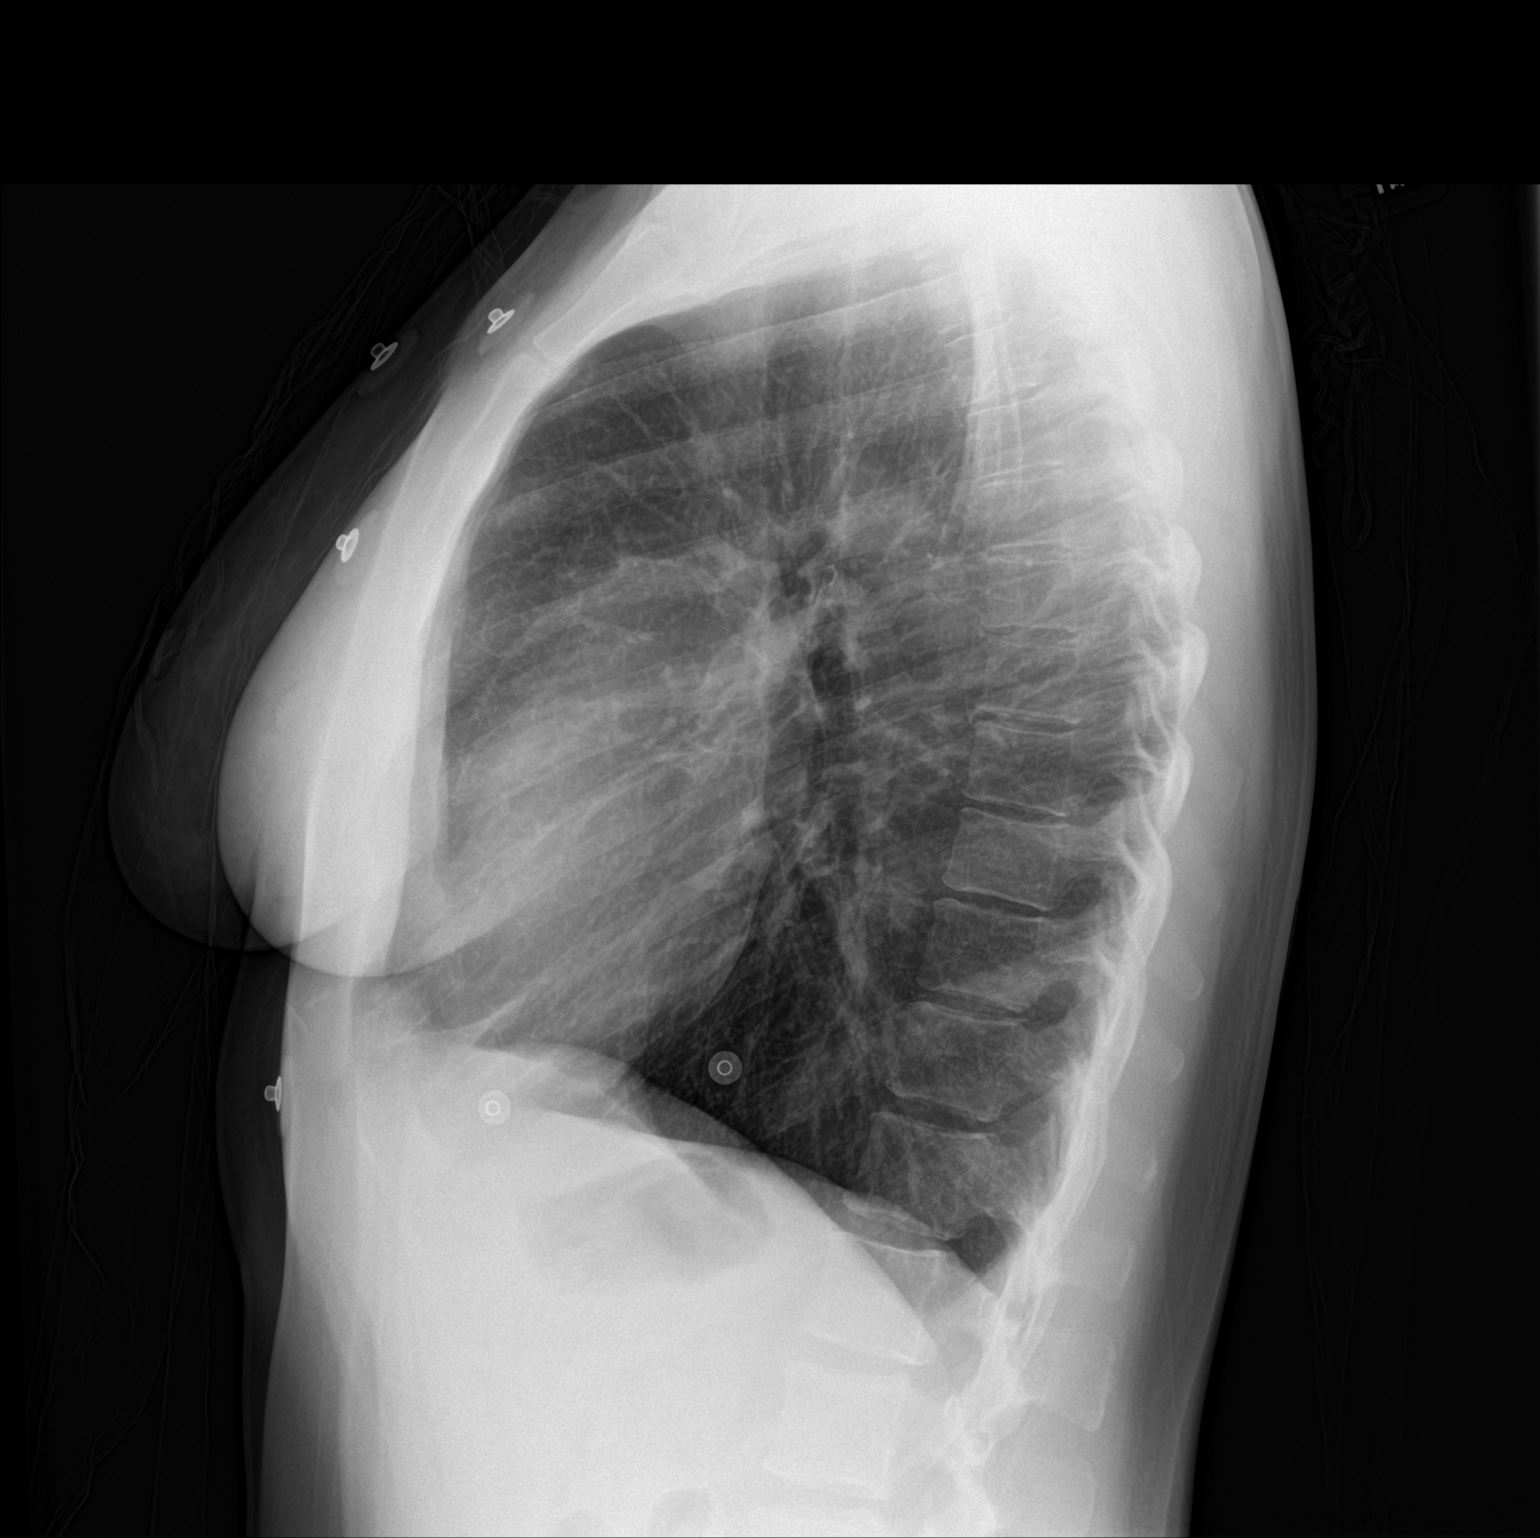

[2 of 2 positions shown; findings below may reference images not displayed]

FINDINGS: Normal heart size. Lungs clear. No pneumothorax. No pleural
effusion.
IMPRESSION: No active cardiopulmonary disease.

## 2018-05-07 DIAGNOSIS — L564 Polymorphous light eruption: Secondary | ICD-10-CM | POA: Diagnosis not present

## 2018-12-18 IMAGING — US US ABDOMEN COMPLETE
1 series · 14 of 25 positions shown · non-contrast
Comparison: CT abdomen and pelvis November 26, 2006

CLINICAL DATA: Elevated liver enzymes.

EXAM:
ABDOMEN ULTRASOUND COMPLETE

[Series 1: us abdomen complete · 0.18mm/px · 14 of 90 slices shown]
[im 1/90]
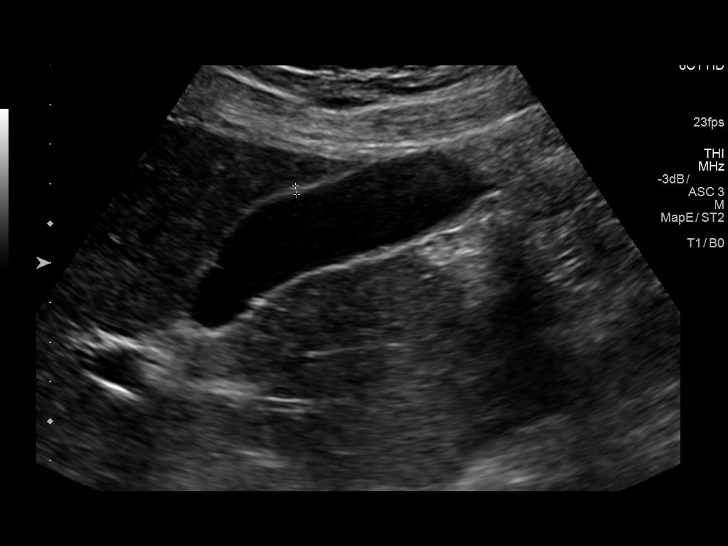
[im 8/90]
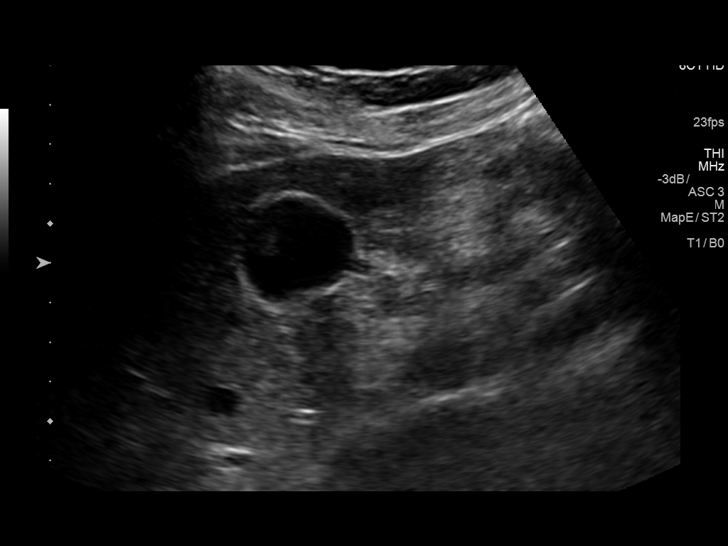
[im 15/90]
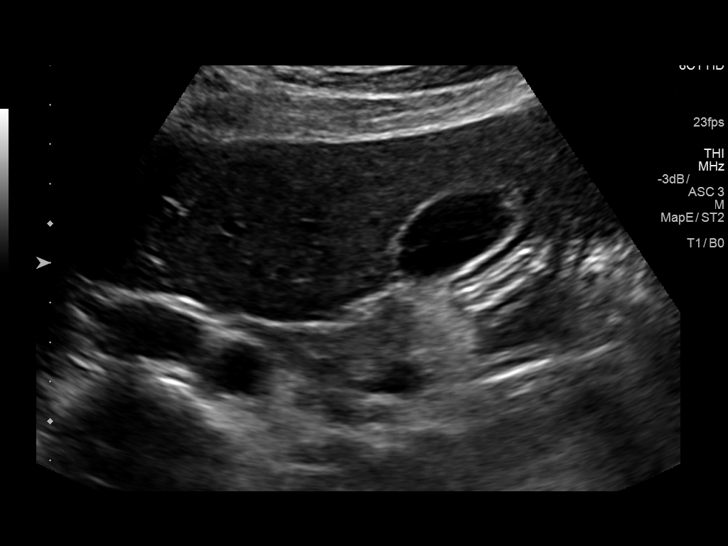
[im 23/90]
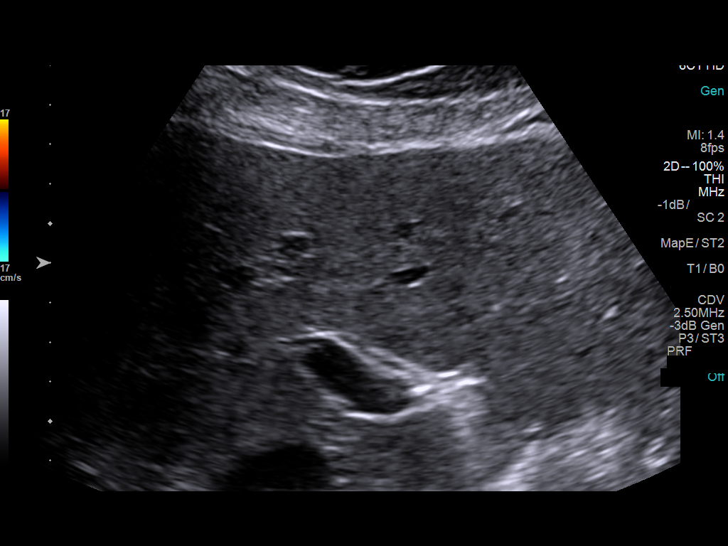
[im 30/90]
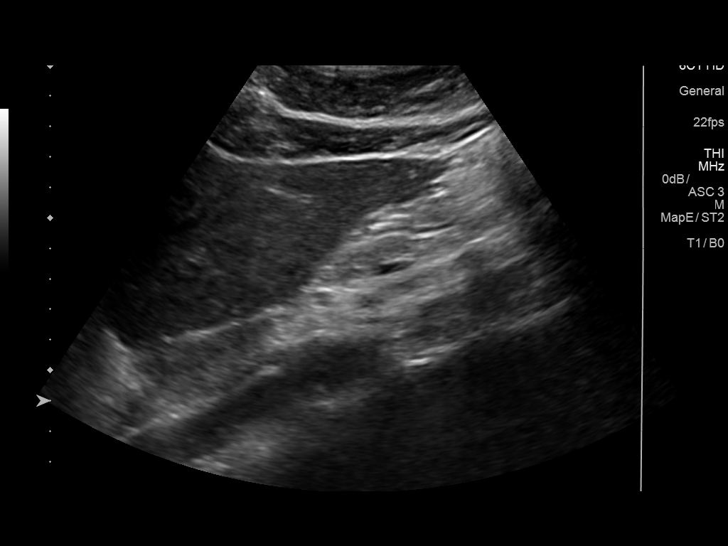
[im 34/90]
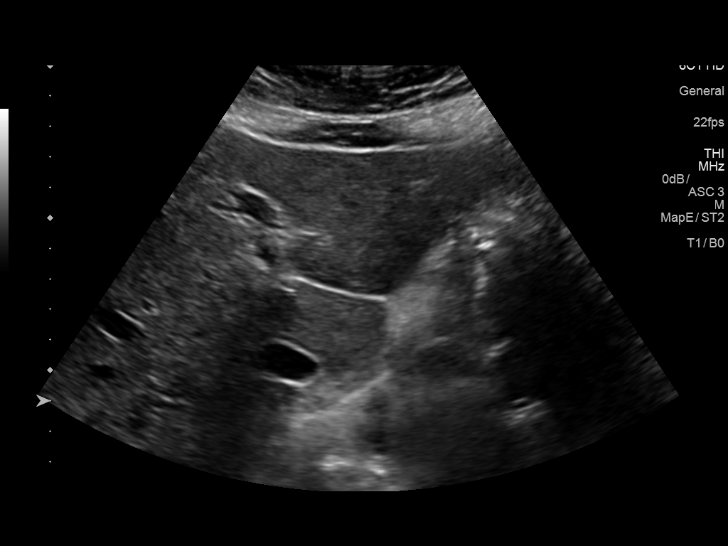
[im 41/90]
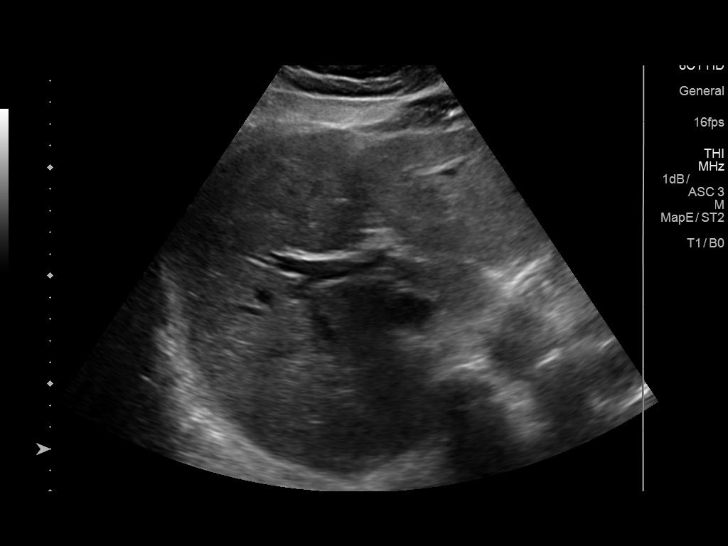
[im 49/90]
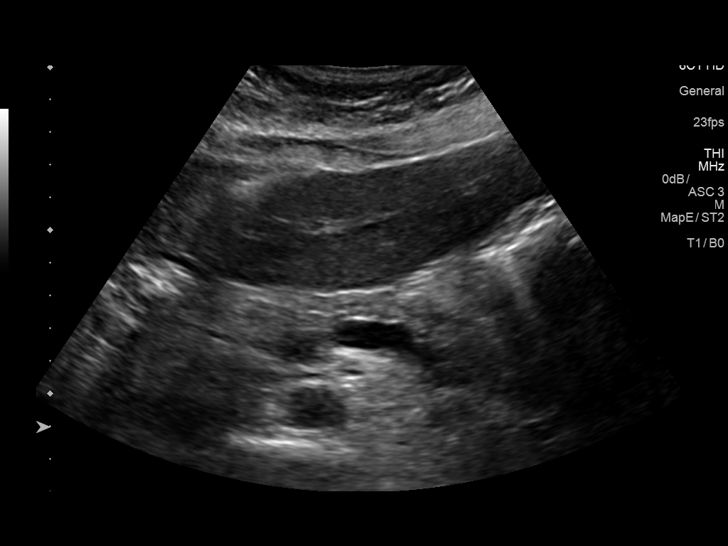
[im 56/90]
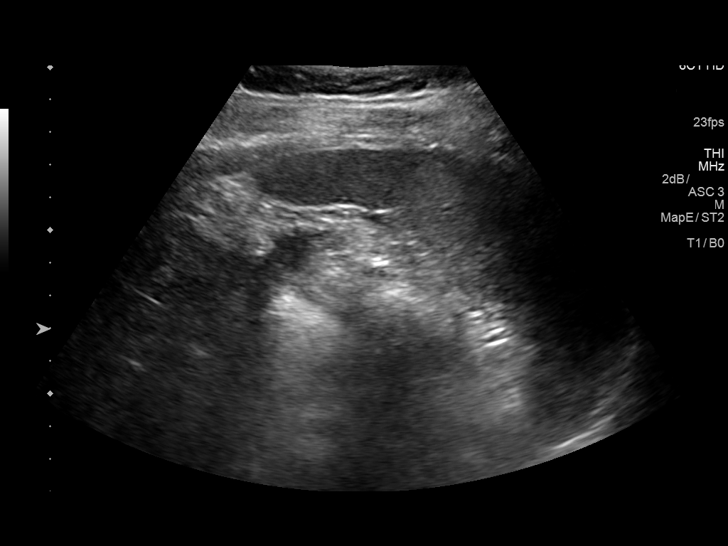
[im 60/90]
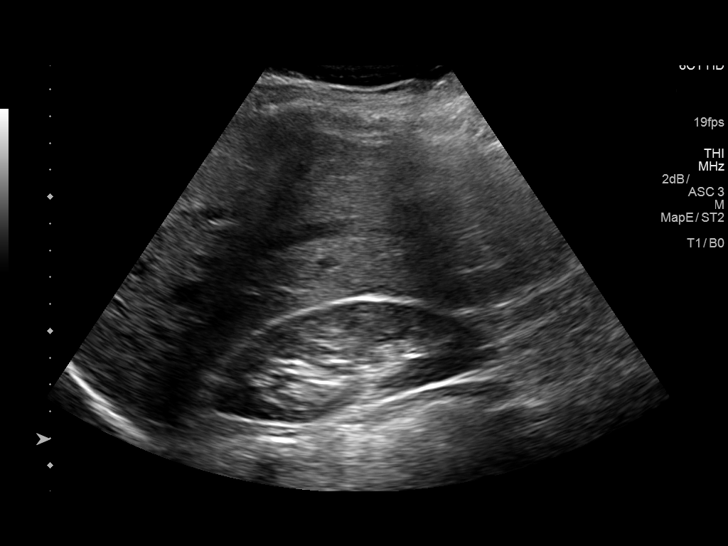
[im 67/90]
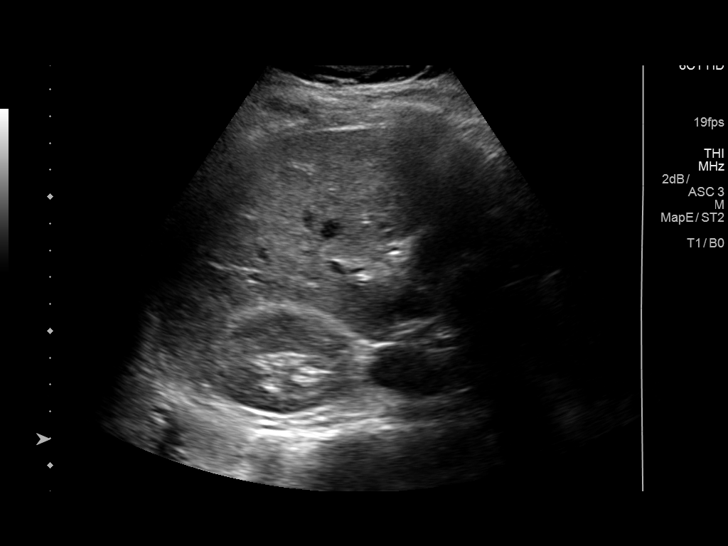
[im 75/90]
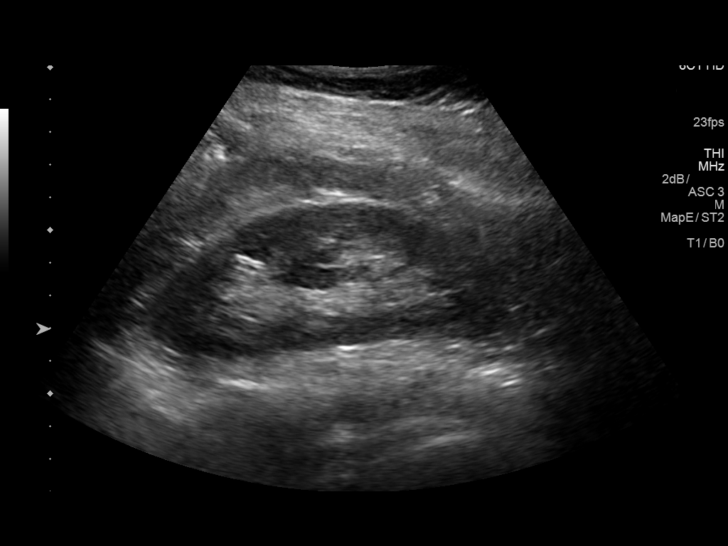
[im 82/90]
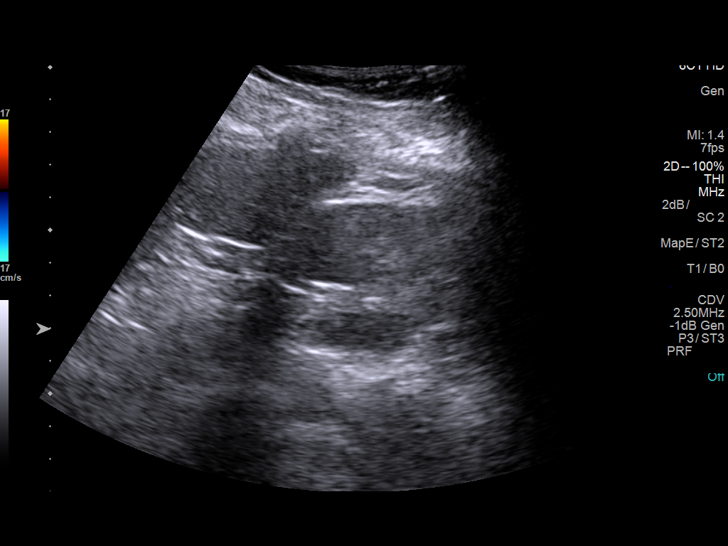
[im 90/90]
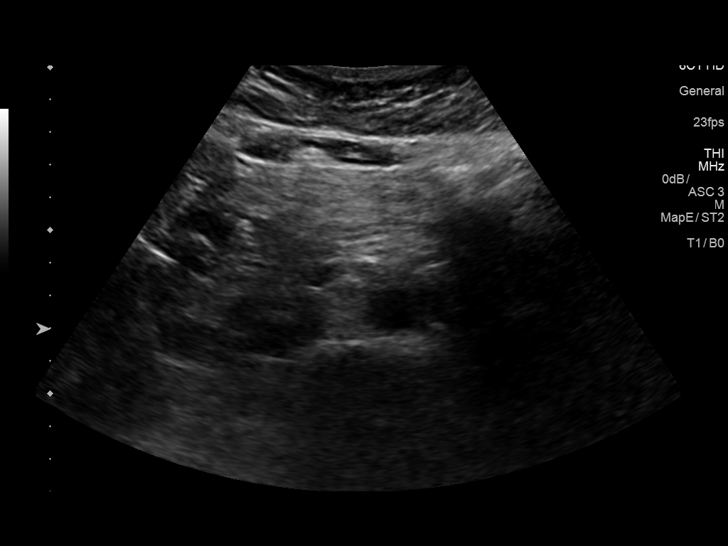

[14 of 25 positions shown; findings below may reference images not displayed]

FINDINGS: Gallbladder: No gallstones or wall thickening visualized. There is
no pericholecystic fluid. No sonographic Murphy sign noted by
sonographer.

Common bile duct: Diameter: 2 mm. There is no intrahepatic, common
hepatic, or common bile duct dilatation.

Liver: No focal lesion identified. Within normal limits in
parenchymal echogenicity. Portal vein is patent on color Doppler
imaging with normal direction of blood flow towards the liver.

IVC: No abnormality visualized.

Pancreas: No pancreatic mass or inflammatory focus.

Spleen: Size and appearance within normal limits.

Right Kidney: Length: 10.5 cm. Echogenicity within normal limits. No
mass or hydronephrosis visualized.

Left Kidney: Length: 10.8 cm. Echogenicity within normal limits. No
mass or hydronephrosis visualized.

Abdominal aorta: No aneurysm visualized.

Other findings: No demonstrable ascites.
IMPRESSION: Study within normal limits.

## 2019-02-18 DIAGNOSIS — F3130 Bipolar disorder, current episode depressed, mild or moderate severity, unspecified: Secondary | ICD-10-CM | POA: Diagnosis not present

## 2019-02-18 DIAGNOSIS — Z79899 Other long term (current) drug therapy: Secondary | ICD-10-CM | POA: Diagnosis not present

## 2019-02-18 DIAGNOSIS — F411 Generalized anxiety disorder: Secondary | ICD-10-CM | POA: Diagnosis not present

## 2019-02-18 DIAGNOSIS — R5383 Other fatigue: Secondary | ICD-10-CM | POA: Diagnosis not present

## 2019-02-18 DIAGNOSIS — F410 Panic disorder [episodic paroxysmal anxiety] without agoraphobia: Secondary | ICD-10-CM | POA: Diagnosis not present

## 2019-02-18 DIAGNOSIS — E559 Vitamin D deficiency, unspecified: Secondary | ICD-10-CM | POA: Diagnosis not present

## 2019-02-18 DIAGNOSIS — F902 Attention-deficit hyperactivity disorder, combined type: Secondary | ICD-10-CM | POA: Diagnosis not present

## 2019-02-26 DIAGNOSIS — F411 Generalized anxiety disorder: Secondary | ICD-10-CM | POA: Diagnosis not present

## 2019-02-26 DIAGNOSIS — F3130 Bipolar disorder, current episode depressed, mild or moderate severity, unspecified: Secondary | ICD-10-CM | POA: Diagnosis not present

## 2019-02-26 DIAGNOSIS — F410 Panic disorder [episodic paroxysmal anxiety] without agoraphobia: Secondary | ICD-10-CM | POA: Diagnosis not present

## 2019-02-26 DIAGNOSIS — F902 Attention-deficit hyperactivity disorder, combined type: Secondary | ICD-10-CM | POA: Diagnosis not present

## 2019-03-05 DIAGNOSIS — Z79899 Other long term (current) drug therapy: Secondary | ICD-10-CM | POA: Diagnosis not present

## 2019-03-05 DIAGNOSIS — L700 Acne vulgaris: Secondary | ICD-10-CM | POA: Diagnosis not present

## 2019-03-19 DIAGNOSIS — Z79899 Other long term (current) drug therapy: Secondary | ICD-10-CM | POA: Diagnosis not present

## 2019-03-26 DIAGNOSIS — Z30431 Encounter for routine checking of intrauterine contraceptive device: Secondary | ICD-10-CM | POA: Diagnosis not present

## 2019-03-26 DIAGNOSIS — N898 Other specified noninflammatory disorders of vagina: Secondary | ICD-10-CM | POA: Diagnosis not present

## 2019-04-09 DIAGNOSIS — Z79899 Other long term (current) drug therapy: Secondary | ICD-10-CM | POA: Diagnosis not present

## 2019-04-09 DIAGNOSIS — L700 Acne vulgaris: Secondary | ICD-10-CM | POA: Diagnosis not present

## 2019-04-29 DIAGNOSIS — Z79899 Other long term (current) drug therapy: Secondary | ICD-10-CM | POA: Diagnosis not present

## 2019-05-13 DIAGNOSIS — Z20822 Contact with and (suspected) exposure to covid-19: Secondary | ICD-10-CM | POA: Diagnosis not present

## 2019-05-13 DIAGNOSIS — N915 Oligomenorrhea, unspecified: Secondary | ICD-10-CM | POA: Diagnosis not present

## 2019-05-13 DIAGNOSIS — G43909 Migraine, unspecified, not intractable, without status migrainosus: Secondary | ICD-10-CM | POA: Diagnosis not present

## 2019-05-13 DIAGNOSIS — R350 Frequency of micturition: Secondary | ICD-10-CM | POA: Diagnosis not present

## 2019-05-13 DIAGNOSIS — R5383 Other fatigue: Secondary | ICD-10-CM | POA: Diagnosis not present

## 2019-05-13 DIAGNOSIS — J069 Acute upper respiratory infection, unspecified: Secondary | ICD-10-CM | POA: Diagnosis not present

## 2019-05-13 DIAGNOSIS — D539 Nutritional anemia, unspecified: Secondary | ICD-10-CM | POA: Diagnosis not present

## 2019-05-13 DIAGNOSIS — Z79899 Other long term (current) drug therapy: Secondary | ICD-10-CM | POA: Diagnosis not present

## 2019-05-13 DIAGNOSIS — R635 Abnormal weight gain: Secondary | ICD-10-CM | POA: Diagnosis not present

## 2019-05-13 DIAGNOSIS — E349 Endocrine disorder, unspecified: Secondary | ICD-10-CM | POA: Diagnosis not present

## 2019-05-14 DIAGNOSIS — L700 Acne vulgaris: Secondary | ICD-10-CM | POA: Diagnosis not present

## 2019-05-14 DIAGNOSIS — Z79899 Other long term (current) drug therapy: Secondary | ICD-10-CM | POA: Diagnosis not present

## 2019-05-20 DIAGNOSIS — F3130 Bipolar disorder, current episode depressed, mild or moderate severity, unspecified: Secondary | ICD-10-CM | POA: Diagnosis not present

## 2019-05-20 DIAGNOSIS — F411 Generalized anxiety disorder: Secondary | ICD-10-CM | POA: Diagnosis not present

## 2019-05-20 DIAGNOSIS — F410 Panic disorder [episodic paroxysmal anxiety] without agoraphobia: Secondary | ICD-10-CM | POA: Diagnosis not present

## 2019-05-20 DIAGNOSIS — F1120 Opioid dependence, uncomplicated: Secondary | ICD-10-CM | POA: Diagnosis not present

## 2019-08-04 DIAGNOSIS — L700 Acne vulgaris: Secondary | ICD-10-CM | POA: Diagnosis not present

## 2019-08-04 DIAGNOSIS — L308 Other specified dermatitis: Secondary | ICD-10-CM | POA: Diagnosis not present

## 2019-09-11 DIAGNOSIS — L249 Irritant contact dermatitis, unspecified cause: Secondary | ICD-10-CM | POA: Diagnosis not present

## 2019-09-11 DIAGNOSIS — L568 Other specified acute skin changes due to ultraviolet radiation: Secondary | ICD-10-CM | POA: Diagnosis not present

## 2019-10-06 DIAGNOSIS — F329 Major depressive disorder, single episode, unspecified: Secondary | ICD-10-CM | POA: Diagnosis not present

## 2019-10-06 DIAGNOSIS — F411 Generalized anxiety disorder: Secondary | ICD-10-CM | POA: Diagnosis not present

## 2019-10-06 DIAGNOSIS — F902 Attention-deficit hyperactivity disorder, combined type: Secondary | ICD-10-CM | POA: Diagnosis not present

## 2019-10-13 DIAGNOSIS — F902 Attention-deficit hyperactivity disorder, combined type: Secondary | ICD-10-CM | POA: Diagnosis not present

## 2019-10-13 DIAGNOSIS — F411 Generalized anxiety disorder: Secondary | ICD-10-CM | POA: Diagnosis not present

## 2019-10-13 DIAGNOSIS — F329 Major depressive disorder, single episode, unspecified: Secondary | ICD-10-CM | POA: Diagnosis not present

## 2019-10-21 DIAGNOSIS — F329 Major depressive disorder, single episode, unspecified: Secondary | ICD-10-CM | POA: Diagnosis not present

## 2019-10-21 DIAGNOSIS — F902 Attention-deficit hyperactivity disorder, combined type: Secondary | ICD-10-CM | POA: Diagnosis not present

## 2019-10-21 DIAGNOSIS — F411 Generalized anxiety disorder: Secondary | ICD-10-CM | POA: Diagnosis not present

## 2019-10-24 DIAGNOSIS — L03019 Cellulitis of unspecified finger: Secondary | ICD-10-CM | POA: Diagnosis not present

## 2019-10-26 DIAGNOSIS — F902 Attention-deficit hyperactivity disorder, combined type: Secondary | ICD-10-CM | POA: Diagnosis not present

## 2019-10-26 DIAGNOSIS — F411 Generalized anxiety disorder: Secondary | ICD-10-CM | POA: Diagnosis not present

## 2019-10-26 DIAGNOSIS — F329 Major depressive disorder, single episode, unspecified: Secondary | ICD-10-CM | POA: Diagnosis not present

## 2019-11-06 DIAGNOSIS — F331 Major depressive disorder, recurrent, moderate: Secondary | ICD-10-CM | POA: Diagnosis not present

## 2019-11-06 DIAGNOSIS — F902 Attention-deficit hyperactivity disorder, combined type: Secondary | ICD-10-CM | POA: Diagnosis not present

## 2019-11-06 DIAGNOSIS — F411 Generalized anxiety disorder: Secondary | ICD-10-CM | POA: Diagnosis not present

## 2019-11-10 DIAGNOSIS — F1121 Opioid dependence, in remission: Secondary | ICD-10-CM | POA: Diagnosis not present

## 2019-11-10 DIAGNOSIS — F900 Attention-deficit hyperactivity disorder, predominantly inattentive type: Secondary | ICD-10-CM | POA: Diagnosis not present

## 2019-11-10 DIAGNOSIS — F320 Major depressive disorder, single episode, mild: Secondary | ICD-10-CM | POA: Diagnosis not present

## 2019-11-10 DIAGNOSIS — F411 Generalized anxiety disorder: Secondary | ICD-10-CM | POA: Diagnosis not present

## 2019-11-16 DIAGNOSIS — Z79891 Long term (current) use of opiate analgesic: Secondary | ICD-10-CM | POA: Diagnosis not present

## 2019-11-16 DIAGNOSIS — F1121 Opioid dependence, in remission: Secondary | ICD-10-CM | POA: Diagnosis not present

## 2019-11-17 DIAGNOSIS — F633 Trichotillomania: Secondary | ICD-10-CM | POA: Diagnosis not present

## 2019-11-17 DIAGNOSIS — L0390 Cellulitis, unspecified: Secondary | ICD-10-CM | POA: Diagnosis not present

## 2019-11-19 DIAGNOSIS — H532 Diplopia: Secondary | ICD-10-CM | POA: Diagnosis not present

## 2019-11-19 DIAGNOSIS — L03116 Cellulitis of left lower limb: Secondary | ICD-10-CM | POA: Diagnosis not present

## 2019-11-20 DIAGNOSIS — L981 Factitial dermatitis: Secondary | ICD-10-CM | POA: Diagnosis not present

## 2019-11-20 DIAGNOSIS — L301 Dyshidrosis [pompholyx]: Secondary | ICD-10-CM | POA: Diagnosis not present

## 2019-12-02 DIAGNOSIS — F1121 Opioid dependence, in remission: Secondary | ICD-10-CM | POA: Diagnosis not present

## 2019-12-02 DIAGNOSIS — F320 Major depressive disorder, single episode, mild: Secondary | ICD-10-CM | POA: Diagnosis not present

## 2019-12-02 DIAGNOSIS — F900 Attention-deficit hyperactivity disorder, predominantly inattentive type: Secondary | ICD-10-CM | POA: Diagnosis not present

## 2019-12-02 DIAGNOSIS — F411 Generalized anxiety disorder: Secondary | ICD-10-CM | POA: Diagnosis not present

## 2019-12-04 DIAGNOSIS — F331 Major depressive disorder, recurrent, moderate: Secondary | ICD-10-CM | POA: Diagnosis not present

## 2019-12-04 DIAGNOSIS — F411 Generalized anxiety disorder: Secondary | ICD-10-CM | POA: Diagnosis not present

## 2019-12-04 DIAGNOSIS — F902 Attention-deficit hyperactivity disorder, combined type: Secondary | ICD-10-CM | POA: Diagnosis not present

## 2019-12-09 DIAGNOSIS — F1121 Opioid dependence, in remission: Secondary | ICD-10-CM | POA: Diagnosis not present

## 2019-12-09 DIAGNOSIS — F320 Major depressive disorder, single episode, mild: Secondary | ICD-10-CM | POA: Diagnosis not present

## 2019-12-09 DIAGNOSIS — F411 Generalized anxiety disorder: Secondary | ICD-10-CM | POA: Diagnosis not present

## 2019-12-09 DIAGNOSIS — F900 Attention-deficit hyperactivity disorder, predominantly inattentive type: Secondary | ICD-10-CM | POA: Diagnosis not present

## 2019-12-10 DIAGNOSIS — F1121 Opioid dependence, in remission: Secondary | ICD-10-CM | POA: Diagnosis not present

## 2019-12-10 DIAGNOSIS — Z79891 Long term (current) use of opiate analgesic: Secondary | ICD-10-CM | POA: Diagnosis not present

## 2019-12-11 DIAGNOSIS — R239 Unspecified skin changes: Secondary | ICD-10-CM | POA: Diagnosis not present

## 2019-12-11 DIAGNOSIS — M25572 Pain in left ankle and joints of left foot: Secondary | ICD-10-CM | POA: Diagnosis not present

## 2019-12-13 ENCOUNTER — Emergency Department (HOSPITAL_COMMUNITY)
Admission: EM | Admit: 2019-12-13 | Discharge: 2019-12-13 | Disposition: A | Discharge status: Home / Self Care | Payer: BC Managed Care – PPO | Attending: Emergency Medicine | Admitting: Emergency Medicine

## 2019-12-13 ENCOUNTER — Other Ambulatory Visit: Payer: Self-pay

## 2019-12-13 ENCOUNTER — Encounter (HOSPITAL_COMMUNITY): Payer: Self-pay | Admitting: Emergency Medicine

## 2019-12-13 DIAGNOSIS — L03116 Cellulitis of left lower limb: Secondary | ICD-10-CM | POA: Diagnosis not present

## 2019-12-13 DIAGNOSIS — M25572 Pain in left ankle and joints of left foot: Secondary | ICD-10-CM | POA: Diagnosis not present

## 2019-12-13 DIAGNOSIS — Z87891 Personal history of nicotine dependence: Secondary | ICD-10-CM | POA: Insufficient documentation

## 2019-12-13 LAB — COMPREHENSIVE METABOLIC PANEL
ALT: 18 U/L (ref 0–44)
AST: 51 U/L — ABNORMAL HIGH (ref 15–41)
Albumin: 4.7 g/dL (ref 3.5–5.0)
Alkaline Phosphatase: 79 U/L (ref 38–126)
Anion gap: 6 (ref 5–15)
BUN: 14 mg/dL (ref 6–20)
CO2: 31 mmol/L (ref 22–32)
Calcium: 9.4 mg/dL (ref 8.9–10.3)
Chloride: 100 mmol/L (ref 98–111)
Creatinine, Ser: 0.77 mg/dL (ref 0.44–1.00)
GFR, Estimated: >60 mL/min (ref >60)
Glucose, Bld: 104 mg/dL — ABNORMAL HIGH (ref 70–99)
Potassium: 3.8 mmol/L (ref 3.5–5.1)
Sodium: 137 mmol/L (ref 135–145)
Total Bilirubin: 0.6 mg/dL (ref 0.3–1.2)
Total Protein: 8.3 g/dL — ABNORMAL HIGH (ref 6.5–8.1)

## 2019-12-13 LAB — CBC WITH DIFFERENTIAL/PLATELET
Abs Immature Granulocytes: 0.03 10*3/uL (ref 0.00–0.07)
Basophils Absolute: 0.1 10*3/uL (ref 0.0–0.1)
Basophils Relative: 1 %
Eosinophils Absolute: 0.6 10*3/uL — ABNORMAL HIGH (ref 0.0–0.5)
Eosinophils Relative: 7 %
HCT: 41.4 % (ref 36.0–46.0)
Hemoglobin: 13.4 g/dL (ref 12.0–15.0)
Immature Granulocytes: 0 %
Lymphocytes Relative: 28 %
Lymphs Abs: 2.5 10*3/uL (ref 0.7–4.0)
MCH: 31.3 pg (ref 26.0–34.0)
MCHC: 32.4 g/dL (ref 30.0–36.0)
MCV: 96.7 fL (ref 80.0–100.0)
Monocytes Absolute: 0.6 10*3/uL (ref 0.1–1.0)
Monocytes Relative: 6 %
Neutro Abs: 5.2 10*3/uL (ref 1.7–7.7)
Neutrophils Relative %: 58 %
Platelets: 514 10*3/uL — ABNORMAL HIGH (ref 150–400)
RBC: 4.28 MIL/uL (ref 3.87–5.11)
RDW: 12.6 % (ref 11.5–15.5)
WBC: 9 10*3/uL (ref 4.0–10.5)
nRBC: 0 % (ref 0.0–0.2)

## 2019-12-13 LAB — LACTIC ACID, PLASMA: Lactic Acid, Venous: 1.5 mmol/L (ref 0.5–1.9)

## 2019-12-13 LAB — I-STAT BETA HCG BLOOD, ED (MC, WL, AP ONLY): I-stat hCG, quantitative: <5 m[IU]/mL (ref <5)

## 2019-12-13 NOTE — ED Notes (Signed)
Patient has been in the bathroom multiple times

## 2019-12-13 NOTE — ED Triage Notes (Signed)
Patient c/o cellulitis to left ankle. Reports seen at [facility] x2 days ago and redness has increased past drawn markings. Reports taking Clindamycin as prescribed. States worsening anxiety as well.

## 2019-12-13 NOTE — ED Notes (Signed)
Delay in triage as patient is in lobby bathroom.

## 2019-12-13 NOTE — Discharge Instructions (Addendum)
Please continue your clindamycin.  Please see your doctor on either Monday or Tuesday for very close recheck.  If you have worsening of your redness, pain, swelling, return to ER for reassessment.

## 2019-12-13 NOTE — ED Provider Notes (Signed)
[facility]-EMERGENCY DEPT Provider Note   CSN: [id number] Arrival date & time: 12/13/19  1325     History Chief Complaint  Patient presents with  . Ankle Pain    Catherine Joyce is a 34 y.o. female.  Presents here with concern for ankle cellulitis.  Reports that a few weeks ago she was diagnosed with cellulitis, completed course of antibiotics and the redness went away.  She went to urgent care on Friday, was having a couple days of redness over her ankle left, given another prescription for clindamycin.  Reports that she took her first dose Friday evening and had been taking it according to prescription since.  Says that she has had slight worsening in her redness.  Denies any worsening swelling, no fever, chills or other concerning symptom.  Able to walk without difficulty.  HPI     Past Medical History:  Diagnosis Date  . Depression   . Drug addict (HCC)   . Migraine     Patient Active Problem List   Diagnosis Date Noted  . Bipolar 1 disorder, depressed, severe (HCC) 06/18/2017  . Elevated liver enzymes 04/26/2016  . GAD (generalized anxiety disorder) 01/25/2016  . VULVITIS 06/21/2009  . OTH D/O MENSTRUATION&OTH ABN BLEED FE GNT TRACT 06/21/2009  . NAUSEA AND VOMITING 04/11/2009  . ABDOMINAL PAIN RIGHT LOWER QUADRANT 04/11/2009  . LARYNGOTRACHEOBRONCHITIS, ACUTE 02/17/2009  . ADVERSE REACTION TO MEDICATION 12/13/2008  . URI 12/06/2008  . DYSURIA 12/06/2008  . BACK STRAIN, ACUTE 11/22/2008  . NECK PAIN, LEFT 11/01/2008  . HEADACHE 11/01/2008  . SORE THROAT 04/16/2008  . Atypical depression 11/06/2006  . ALLERGIC RHINITIS 11/06/2006  . ACNE, MILD 11/06/2006  . Abdominal pain, unspecified site 11/06/2006    History reviewed. No pertinent surgical history.   OB History   No obstetric history on file.     No family history on file.  Social History   Tobacco Use  . Smoking status: Former Smoker    Types: Cigarettes    Quit date: 2017     Years since quitting: 4.8  . Smokeless tobacco: Never Used  Substance Use Topics  . Alcohol use: No  . Drug use: Not Currently    Home Medications Prior to Admission medications   Medication Sig Start Date End Date Taking? Authorizing Provider  buPROPion (WELLBUTRIN XL) 150 MG 24 hr tablet Take 300 mg by mouth every morning. 12/07/17   [provider]  clonazePAM (KLONOPIN) 1 MG tablet TAKE 1 TABLET BY MOUTH EVERY DAY (fill ONLY every 10 DAYS) 12/24/17   [provider]  cloNIDine (CATAPRES) 0.1 MG tablet Take 0.2 mg by mouth at bedtime. 11/06/17   [provider]  hydrOXYzine (ATARAX/VISTARIL) 10 MG tablet Take 1 tablet (10 mg total) by mouth every 6 (six) hours as needed for anxiety. 06/23/17   Charm Rings, NP  hydrOXYzine (ATARAX/VISTARIL) 50 MG tablet Take 1 tablet (50 mg total) by mouth at bedtime. 06/23/17   Charm Rings, NP  lamoTRIgine (LAMICTAL) 200 MG tablet Take 200 mg by mouth 3 (three) times daily.    [provider]  spironolactone (ALDACTONE) 50 MG tablet Take 50 mg by mouth daily.    [provider]  ZUBSOLV 5.7-1.4 MG SUBL DISSOLVE ONE TABLET BY MOUTH EVERY 8 HOURS 12/06/17   [provider]    Allergies    Sulfonamide derivatives  Review of Systems   Review of Systems  Constitutional: Negative for chills and fever.  HENT:  Negative for ear pain and sore throat.   Eyes: Negative for pain and visual disturbance.  Respiratory: Negative for cough and shortness of breath.   Cardiovascular: Negative for chest pain and palpitations.  Gastrointestinal: Negative for abdominal pain and vomiting.  Genitourinary: Negative for dysuria and hematuria.  Musculoskeletal: Negative for arthralgias and back pain.  Skin: Positive for rash. Negative for color change.  Neurological: Negative for seizures and syncope.  All other systems reviewed and are negative.   Physical Exam Updated Vital Signs BP (!) 145/97 (BP Location:  Right Arm)   Pulse (!) 107   Temp 98.9 F (37.2 C) (Oral)   Resp 20   SpO2 97%   Physical Exam Vitals and nursing note reviewed.  Constitutional:      General: She is not in acute distress.    Appearance: She is well-developed.  HENT:     Head: Normocephalic and atraumatic.  Eyes:     Conjunctiva/sclera: Conjunctivae normal.  Cardiovascular:     Rate and Rhythm: Normal rate and regular rhythm.     Heart sounds: No murmur heard.   Pulmonary:     Effort: Pulmonary effort is normal. No respiratory distress.     Breath sounds: Normal breath sounds.  Abdominal:     Palpations: Abdomen is soft.     Tenderness: There is no abdominal tenderness.  Musculoskeletal:     Cervical back: Neck supple.     Comments: Rash as below, left ankle has normal range of motion, normal DP/PT pulses  Skin:    General: Skin is warm and dry.     Comments: Erythema to dorsum of foot extending to lower lower leg just above ankle, blanchable, slightly warm to touch  Neurological:     Mental Status: She is alert.     ED Results / Procedures / Treatments   Labs (all labs ordered are listed, but only abnormal results are displayed) Labs Reviewed  COMPREHENSIVE METABOLIC PANEL - Abnormal; Notable for the following components:      Result Value   Glucose, Bld 104 (*)    Total Protein 8.3 (*)    AST 51 (*)    All other components within normal limits  CBC WITH DIFFERENTIAL/PLATELET - Abnormal; Notable for the following components:   Platelets 514 (*)    Eosinophils Absolute 0.6 (*)    All other components within normal limits  LACTIC ACID, PLASMA  LACTIC ACID, PLASMA  I-STAT BETA HCG BLOOD, ED ([facility], [facility], [facility] ONLY)    EKG None  Radiology No results found.  Procedures Procedures (including critical care time)  Medications Ordered in ED Medications - No data to display  ED Course  I have reviewed the triage vital signs and the nursing notes.  Pertinent labs & imaging results that were  available during my care of the patient were reviewed by me and considered in my medical decision making (see chart for details).    MDM Rules/Calculators/A&P                         34 year old lady presents to ER with concern for left ankle cellulitis.  She does appear to have cellulitis to her dorsum of foot, ankle.  Normal joint range of motion, bearing weight without difficulty, doubt joint infection.  There was slight worsening of the erythema beyond markings from Friday.  Patient has had less than 48 hours of this antibiotic.  She is otherwise well-appearing, her lab work today  is reassuring, do not feel she has necessarily failed outpatient therapy.  Believe she is appropriate for trial of continued outpatient therapy.  Recommend she have recheck with your primary doctor tomorrow.  Reviewed return precautions and discharged home.  After the discussed management above, the patient was determined to be safe for discharge.  The patient was in agreement with this plan and all questions regarding their care were answered.  ED return precautions were discussed and the patient will return to the ED with any significant worsening of condition.    Final Clinical Impression(s) / ED Diagnoses Final diagnoses:  Cellulitis of left lower extremity    Rx / DC Orders ED Discharge Orders    None       Milagros Loll, MD 12/13/19 2121

## 2019-12-14 DIAGNOSIS — F411 Generalized anxiety disorder: Secondary | ICD-10-CM | POA: Diagnosis not present

## 2019-12-14 DIAGNOSIS — F1121 Opioid dependence, in remission: Secondary | ICD-10-CM | POA: Diagnosis not present

## 2019-12-14 DIAGNOSIS — F900 Attention-deficit hyperactivity disorder, predominantly inattentive type: Secondary | ICD-10-CM | POA: Diagnosis not present

## 2019-12-15 ENCOUNTER — Encounter (HOSPITAL_COMMUNITY): Payer: Self-pay | Admitting: Registered Nurse

## 2019-12-15 ENCOUNTER — Ambulatory Visit (HOSPITAL_COMMUNITY)
Admission: EM | Admit: 2019-12-15 | Discharge: 2019-12-15 | Disposition: A | Discharge status: Home / Self Care | Payer: BC Managed Care – PPO | Attending: Registered Nurse | Admitting: Registered Nurse

## 2019-12-15 ENCOUNTER — Other Ambulatory Visit: Payer: Self-pay

## 2019-12-15 DIAGNOSIS — R748 Abnormal levels of other serum enzymes: Secondary | ICD-10-CM | POA: Diagnosis present

## 2019-12-15 DIAGNOSIS — F310 Bipolar disorder, current episode hypomanic: Secondary | ICD-10-CM | POA: Diagnosis not present
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
## 2019-12-15 NOTE — ED Notes (Signed)
Nurse Discharge Note:  D:Patient denies SI/HI/AVH at this time. Pt appears calm and cooperative, and no distress noted.  A: All Personal items in locker returned to pt. Pt given AVS and patient verbalized understanding. Pt escorted to the lobby by staff.  R:  Pt States she will comply with follow-up with her psychiatrist and take medications as prescribed.Marland Kitchen

## 2019-12-15 NOTE — Discharge Instructions (Signed)
Follow up with [facility]:  Your current psychiatric provider.

## 2019-12-15 NOTE — ED Triage Notes (Addendum)
Patient states she feels she had a manic episode last week when she took more of her medication than what was prescribed. Patient also states she was having auditory hallucinations last week where she was hearing noises and her husband who was not present. Patient denies SI/HI and A/V/H today. Patient is alert, oriented and ambulatory with a steady gait. Patient states she took Ativan and Cymbalta and was referred by her psychiatrist Dr. Sallyanne Havers of [facility].

## 2019-12-15 NOTE — ED Provider Notes (Signed)
Behavioral Health Urgent Care Medical Screening Exam  Patient Name: Catherine Joyce MRN: [id number] Date of Evaluation: 12/15/19 Chief Complaint:   Diagnosis:  Final diagnoses:  None    History of Present illness: Otis Burress is a 34 y.o. female patient presents as a walk in at [facility] with complaints that she was sent by her Primary psychiatrist at [facility] to rule out withdrawal from Benzodiazapine.    Catherine Joyce, 34 y.o., female patient seen face to face by this provider, consulted with Dr. Evelene Croon; and chart reviewed on 12/15/19.  On evaluation Catherine Joyce reports "Last week I took to much of my medication all of them.  I think I was having a manic episode but I've never been diagnosed with bipolar disorder."  Patient states after taking to much medications she felt really manic, high energy, and was having auditory hallucinations."  Currently patient denies any psychosis and also denies any withdrawal symptoms.  Patient states she has felt pretty tired since she hasn't had any Adderall to take.  States that she didn't take her Ativan daily.  "Some days I would take as may as prescribed, some days less, other day none.  I may go 2-3 days without needing any.  But last week I was really anxious and had a lot going on and took more than I was suppose to."  Patient states she had 2-3 Ativan left and was taking 1/2 tab daily now until she can have refilled.  Patient states that she doesn't feel suicidal and would never try to kill herself.  Discussed the signs/symptoms of Benzo withdrawal with patient also discussed the s/s or over use of stimulant like Adderall.  Understanding voiced.  Patient lives at home with her husband and 12 yr old son.  Patient is suppose to be starting a new job tomorrow; she also works in Print production planner.   During evaluation Catherine Joyce is alert/oriented x 4; calm/cooperative; and mood anxious and is congruent with affect.  She does not appear to be responding to  internal/external stimuli or delusional thoughts.  Patient denies suicidal/self-harm/homicidal ideation, psychosis, and paranoia.  Patient answered question appropriately.  Patient encouraged to follow up with her current psychiatric provider for refills of medication.     Psychiatric Specialty Exam  Presentation  General Appearance:Appropriate for Environment;Casual  Eye Contact:Good  Speech:Clear and Coherent;Pressured  Speech Volume:Normal  Handedness:Right   Mood and Affect  Mood:Anxious  Affect:Appropriate;Congruent   Thought Process  Thought Processes:Coherent;Goal Directed  Descriptions of Associations:Intact  Orientation:Full (Time, Place and Person)  Thought Content:Logical;WDL  Hallucinations:None  Ideas of Reference:None  Suicidal Thoughts:No  Homicidal Thoughts:No   Sensorium  Memory:Immediate Good;Recent Good;Remote Good  Judgment:Intact  Insight:Present   Executive Functions  Concentration:Good  Attention Span:Good  Recall:Good  Fund of Knowledge:Good  Language:Good   Psychomotor Activity  Psychomotor Activity:Normal   Assets  Assets:Communication Skills;Desire for Improvement;Financial Resources/Insurance;Housing;Social Support;Transportation;Vocational/Educational   Sleep  Sleep:Good  Number of hours: No data recorded  Physical Exam: Physical Exam Constitutional:      General: She is not in acute distress.    Appearance: Normal appearance. She is obese. She is not ill-appearing, toxic-appearing or diaphoretic.  HENT:     Head: Normocephalic and atraumatic.  Cardiovascular:     Rate and Rhythm: Normal rate and regular rhythm.  Pulmonary:     Effort: Pulmonary effort is normal.  Musculoskeletal:        General: Normal range of motion.     Cervical back: Normal  range of motion and neck supple.  Skin:    General: Skin is warm and dry.  Neurological:     General: No focal deficit present.     Mental Status: She is  alert and oriented to person, place, and time.  Psychiatric:        Attention and Perception: Attention and perception normal. She does not perceive auditory or visual hallucinations.        Mood and Affect: Affect normal. Mood is anxious.        Speech: Speech is rapid and pressured.        Behavior: Behavior normal. Behavior is cooperative.        Thought Content: Thought content normal. Thought content is not paranoid or delusional. Thought content does not include homicidal or suicidal ideation.        Cognition and Memory: Cognition and memory normal.        Judgment: Judgment normal.    Review of Systems  Psychiatric/Behavioral: Negative for memory loss. Depression: Stable. Hallucinations: Denies. Substance abuse: Denies. Suicidal ideas: Denies. The patient does not have insomnia. Nervous/anxious: Stable.        Patient states that she took to much of her medications last week (Adderall and Ativan) wanted to make sure she wasn't going through withdrawal from Benzo's and states last week she also had auditory hallucinations which may have been the result of taking to much Adderall.    Denies any withdrawal symptoms at this time.  Encouraged to go to ED if felt any symptoms of withdrawl  All other systems reviewed and are negative.  There were no vitals taken for this visit. There is no height or weight on file to calculate BMI.  Musculoskeletal: Strength & Muscle Tone: within normal limits Gait & Station: normal Patient leans: N/A   [facility] MSE Discharge Disposition for Follow up and Recommendations: Based on my evaluation the patient does not appear to have an emergency medical condition and can be discharged with resources and follow up care in outpatient services for Medication Management and Individual Therapy     Discharge Instructions     Follow up with [facility]:  Your current psychiatric provider.          Rithika Seel, NP 12/15/2019, 4:27 PM

## 2019-12-16 ENCOUNTER — Telehealth (HOSPITAL_COMMUNITY): Payer: Self-pay

## 2019-12-16 NOTE — Telephone Encounter (Signed)
Care Management - Follow Up [facility] Discharges   Writer left a HIPPA compliant voice mail.    Writer left name and phone number for the patent to call back if further assistance is needed in scheduling a follow up appointment with an outpatient provider.     

## 2020-01-01 DIAGNOSIS — F902 Attention-deficit hyperactivity disorder, combined type: Secondary | ICD-10-CM | POA: Diagnosis not present

## 2020-01-01 DIAGNOSIS — F411 Generalized anxiety disorder: Secondary | ICD-10-CM | POA: Diagnosis not present

## 2020-01-01 DIAGNOSIS — F331 Major depressive disorder, recurrent, moderate: Secondary | ICD-10-CM | POA: Diagnosis not present

## 2020-01-05 ENCOUNTER — Emergency Department (HOSPITAL_COMMUNITY)
Admission: EM | Admit: 2020-01-05 | Discharge: 2020-01-06 | Discharge status: Against Medical Advice | Payer: BC Managed Care – PPO | Attending: Emergency Medicine | Admitting: Emergency Medicine

## 2020-01-05 ENCOUNTER — Other Ambulatory Visit: Payer: Self-pay

## 2020-01-05 DIAGNOSIS — Z87891 Personal history of nicotine dependence: Secondary | ICD-10-CM | POA: Diagnosis not present

## 2020-01-05 DIAGNOSIS — T887XXA Unspecified adverse effect of drug or medicament, initial encounter: Secondary | ICD-10-CM | POA: Diagnosis not present

## 2020-01-05 DIAGNOSIS — T50902A Poisoning by unspecified drugs, medicaments and biological substances, intentional self-harm, initial encounter: Secondary | ICD-10-CM | POA: Insufficient documentation

## 2020-01-05 DIAGNOSIS — R569 Unspecified convulsions: Secondary | ICD-10-CM | POA: Diagnosis not present

## 2020-01-05 DIAGNOSIS — T50904A Poisoning by unspecified drugs, medicaments and biological substances, undetermined, initial encounter: Secondary | ICD-10-CM | POA: Diagnosis not present

## 2020-01-05 DIAGNOSIS — R000 Tachycardia, unspecified: Secondary | ICD-10-CM | POA: Diagnosis not present

## 2020-01-05 NOTE — ED Triage Notes (Signed)
Poison Control called and advised an EKG, BMET, tylenol level, then another one in 4 hours, activated charcoal without sorbitol and to observe 6-8 hours

## 2020-01-05 NOTE — ED Triage Notes (Signed)
Pt BIB EMS from home for intentional drug overdose. Reports pt took 3g of tylenol and 300 mg of Trazodone around 2130. Denies physical complaints. Endorses fatigue. 18G LAC; 400 cc NS given by EMS.

## 2020-01-05 NOTE — ED Triage Notes (Incomplete)
Pt BIB EMS from home for intentional drug overdose. Reports pt took 3g of tylenol and 300 mg of Trazodone ~ 2 hours ago.

## 2020-01-06 DIAGNOSIS — M7592 Shoulder lesion, unspecified, left shoulder: Secondary | ICD-10-CM | POA: Diagnosis not present

## 2020-01-06 DIAGNOSIS — F1520 Other stimulant dependence, uncomplicated: Secondary | ICD-10-CM | POA: Diagnosis not present

## 2020-01-06 DIAGNOSIS — N760 Acute vaginitis: Secondary | ICD-10-CM | POA: Diagnosis not present

## 2020-01-06 DIAGNOSIS — I10 Essential (primary) hypertension: Secondary | ICD-10-CM | POA: Diagnosis not present

## 2020-01-06 DIAGNOSIS — Z720 Tobacco use: Secondary | ICD-10-CM | POA: Diagnosis not present

## 2020-01-06 DIAGNOSIS — F1121 Opioid dependence, in remission: Secondary | ICD-10-CM | POA: Diagnosis not present

## 2020-01-06 DIAGNOSIS — F1320 Sedative, hypnotic or anxiolytic dependence, uncomplicated: Secondary | ICD-10-CM | POA: Diagnosis not present

## 2020-01-06 DIAGNOSIS — F332 Major depressive disorder, recurrent severe without psychotic features: Secondary | ICD-10-CM | POA: Diagnosis not present

## 2020-01-06 DIAGNOSIS — N6459 Other signs and symptoms in breast: Secondary | ICD-10-CM | POA: Diagnosis not present

## 2020-01-06 DIAGNOSIS — F411 Generalized anxiety disorder: Secondary | ICD-10-CM | POA: Diagnosis not present

## 2020-01-06 LAB — URINALYSIS, ROUTINE W REFLEX MICROSCOPIC
Bacteria, UA: NONE SEEN
Bilirubin Urine: NEGATIVE
Glucose, UA: NEGATIVE mg/dL
Ketones, ur: NEGATIVE mg/dL
Leukocytes,Ua: NEGATIVE
Nitrite: NEGATIVE
Protein, ur: NEGATIVE mg/dL
Specific Gravity, Urine: 1.005 (ref 1.005–1.030)
pH: 6 (ref 5.0–8.0)

## 2020-01-06 LAB — RAPID URINE DRUG SCREEN, HOSP PERFORMED
Amphetamines: POSITIVE — AB
Barbiturates: NOT DETECTED
Benzodiazepines: POSITIVE — AB
Cocaine: NOT DETECTED
Opiates: NOT DETECTED
Tetrahydrocannabinol: NOT DETECTED

## 2020-01-06 NOTE — ED Notes (Signed)
Poison control called back to check on patient and they were informed that patient had eloped. Pt is now under IV and if she returns they suggest we obtain and EKG, tylenol level, magnesium, and a CMET And to call and let them know she is back

## 2020-01-06 NOTE — ED Provider Notes (Signed)
[facility]-EMERGENCY DEPT Provider Note   CSN: [id number] Arrival date & time: 01/05/20  2347     History Chief Complaint  Patient presents with  . Drug Overdose  . Suicidal    Catherine Joyce is a 34 y.o. female with a history of generalized anxiety disorder, opiate use disorder in remission, and bipolar 1 disorder who presents to the emergency department by EMS with a chief complaint of drug overdose.  The patient reports that she took approximately 3000 mg of Tylenol and 300 mg of trazodone at approximately 21:30.  She denies suicidal ideation, but states " I been feeling terrible for some time.  I just feel as if my medications are out of whack.  I have not felt suicidal since 2018, but I do not want to go on living like this."  She denies HI or auditory visual hallucinations.  No history of prior suicide attempts.  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  She also reports increasing stress at home with her family.  She states that she has put on weight and she feels as if she is no longer good enough for her husband.  She states that she constantly feels as if she disappoints him.  She states that she will have a depressive episode every couple of years, but when she is going through 1 of these episodes she does not feel supported by him.  She also is worried about her 31-year-old son and she does not want him to see her "like this".   She reports that she has taken a month off of work and has a plane ticket to [place] to go to a 1 month mental health facility called [facility].  She reports that she has had multiple medication changes in the last few months.  She was started on Cymbalta approximately 6 weeks ago, but did not like the way it made her feel.  She just completed a 2-week taper of 60 milligrams and is starting a 2-week course of 40 mg; however, when  she was seen on Friday by her mental health specialist she was not given a refill of the medication.  She also takes 300 mg of Wellbutrin XR in the morning, propanolol 10 mg in the a.m. and 20 mg at night, 1 mg of Ativan 3 times daily as needed, 100 mg of trazodone as needed.  She states that she was on Adderall, but the medication was stopped approximately 3 weeks ago, but she states that she will take half of a tablet as needed.  She denies taking this medication today.  She does report that for the last week that she has been taking Ativan almost daily.  She took 1 dose this morning and 2 doses this afternoon.  She also reports that after not drinking alcohol since 2015 that at approximately 13:30 that she drank ~33 ounces of Rose wine.  She does vape tobacco.  She denies any other illicit or recreational substance use.  She is feeling drowsy, but has no other complaints at this time.  She denies nausea, vomiting, fever, chills, chest pain, shortness of breath.   The history is provided by the patient and medical records. No language interpreter was used.       Past Medical History:  Diagnosis Date  . Depression   . Drug addict (HCC)   .  Migraine     Patient Active Problem List   Diagnosis Date Noted  . Bipolar 1 disorder, depressed, severe (HCC) 06/18/2017  . Elevated liver enzymes 04/26/2016  . GAD (generalized anxiety disorder) 01/25/2016  . VULVITIS 06/21/2009  . OTH D/O MENSTRUATION&OTH ABN BLEED FE GNT TRACT 06/21/2009  . NAUSEA AND VOMITING 04/11/2009  . ABDOMINAL PAIN RIGHT LOWER QUADRANT 04/11/2009  . LARYNGOTRACHEOBRONCHITIS, ACUTE 02/17/2009  . ADVERSE REACTION TO MEDICATION 12/13/2008  . URI 12/06/2008  . DYSURIA 12/06/2008  . BACK STRAIN, ACUTE 11/22/2008  . NECK PAIN, LEFT 11/01/2008  . HEADACHE 11/01/2008  . SORE THROAT 04/16/2008  . Atypical depression 11/06/2006  . ALLERGIC RHINITIS 11/06/2006  . ACNE, MILD 11/06/2006  . Abdominal pain, unspecified site  11/06/2006    No past surgical history on file.   OB History   No obstetric history on file.     No family history on file.  Social History   Tobacco Use  . Smoking status: Former Smoker    Types: Cigarettes    Quit date: 2017    Years since quitting: 4.9  . Smokeless tobacco: Never Used  Substance Use Topics  . Alcohol use: No  . Drug use: Not Currently    Home Medications Prior to Admission medications   Medication Sig Start Date End Date Taking? Authorizing Provider  buPROPion (WELLBUTRIN XL) 150 MG 24 hr tablet Take 300 mg by mouth every morning. 12/07/17   [provider]  clonazePAM (KLONOPIN) 1 MG tablet TAKE 1 TABLET BY MOUTH EVERY DAY (fill ONLY every 10 DAYS) 12/24/17   [provider]  cloNIDine (CATAPRES) 0.1 MG tablet Take 0.2 mg by mouth at bedtime. 11/06/17   [provider]  hydrOXYzine (ATARAX/VISTARIL) 10 MG tablet Take 1 tablet (10 mg total) by mouth every 6 (six) hours as needed for anxiety. 06/23/17   Charm Rings, NP  hydrOXYzine (ATARAX/VISTARIL) 50 MG tablet Take 1 tablet (50 mg total) by mouth at bedtime. 06/23/17   Charm Rings, NP  lamoTRIgine (LAMICTAL) 200 MG tablet Take 200 mg by mouth 3 (three) times daily.    [provider]  spironolactone (ALDACTONE) 50 MG tablet Take 50 mg by mouth daily.    [provider]  ZUBSOLV 5.7-1.4 MG SUBL DISSOLVE ONE TABLET BY MOUTH EVERY 8 HOURS 12/06/17   [provider]    Allergies    Sulfonamide derivatives  Review of Systems   Review of Systems  Constitutional: Negative for activity change, chills and fever.  HENT: Negative for congestion and sore throat.   Respiratory: Negative for cough, shortness of breath and wheezing.   Cardiovascular: Negative for chest pain and palpitations.  Gastrointestinal: Negative for abdominal pain, diarrhea, nausea and vomiting.  Genitourinary: Negative for dysuria.  Musculoskeletal: Negative for back pain,  myalgias, neck pain and neck stiffness.  Skin: Negative for rash.  Allergic/Immunologic: Negative for immunocompromised state.  Neurological: Negative for headaches.  Psychiatric/Behavioral: Positive for dysphoric mood and self-injury. Negative for agitation, confusion, hallucinations and sleep disturbance. The patient is nervous/anxious.     Physical Exam Updated Vital Signs BP 120/80   Pulse (!) 103   Temp 98.7 F (37.1 C) (Oral)   Resp 16   SpO2 97%   Physical Exam Vitals and nursing note reviewed.  Constitutional:      Appearance: She is not toxic-appearing.     Comments: Tearful on exam  HENT:     Head: Normocephalic.     Nose: Nose normal.  Eyes:     Conjunctiva/sclera: Conjunctivae normal.  Cardiovascular:     Rate and Rhythm: Normal rate and regular rhythm.     Heart sounds: No murmur heard.  No friction rub. No gallop.   Pulmonary:     Effort: Pulmonary effort is normal. No respiratory distress.     Breath sounds: No stridor. No wheezing, rhonchi or rales.  Chest:     Chest wall: No tenderness.  Abdominal:     General: There is no distension.     Palpations: Abdomen is soft. There is no mass.     Tenderness: There is no abdominal tenderness. There is no right CVA tenderness, left CVA tenderness, guarding or rebound.     Hernia: No hernia is present.     Comments: Abdomen is soft and nontender  Musculoskeletal:     Cervical back: Neck supple.  Skin:    General: Skin is warm.     Coloration: Skin is not jaundiced or pale.     Findings: No rash.  Neurological:     Mental Status: She is alert.  Psychiatric:        Attention and Perception: She does not perceive auditory or visual hallucinations.        Mood and Affect: Affect is tearful.        Speech: Speech normal.        Behavior: Behavior is withdrawn.        Thought Content: Thought content does not include homicidal or suicidal ideation. Thought content does not include homicidal or suicidal plan.         Judgment: Judgment is impulsive.     ED Results / Procedures / Treatments   Labs (all labs ordered are listed, but only abnormal results are displayed) Labs Reviewed  RAPID URINE DRUG SCREEN, HOSP PERFORMED - Abnormal; Notable for the following components:      Result Value   Benzodiazepines POSITIVE (*)    Amphetamines POSITIVE (*)    All other components within normal limits  URINALYSIS, ROUTINE W REFLEX MICROSCOPIC - Abnormal; Notable for the following components:   Color, Urine STRAW (*)    Hgb urine dipstick SMALL (*)    All other components within normal limits  RESP PANEL BY RT-PCR (FLU A&B, COVID) [id number]    EKG None  Radiology No results found.  Procedures Procedures (including critical care time)  Medications Ordered in ED Medications - No data to display  ED Course  I have reviewed the triage vital signs and the nursing notes.  Pertinent labs & imaging results that were available during my care of the patient were reviewed by me and considered in my medical decision making (see chart for details).    MDM Rules/Calculators/A&P                          34 year old female with a history of generalized anxiety disorder, opiate use disorder in remission, and bipolar 1 disorder who presented to the ER by EMS after an intentional overdose.  Although she adamantly denies suicidal ideation, she states that she took the medication because she "didn't want to go on living like this."  She endorses progressively worsening stressors related to her family at her job.  She notes that she was actually planning to take a month off of work to go to a rehab facility because the ranch in [place] tomorrow.  Tonight, she took 3000 mg of Tylenol and 300 mg of  trazodone at 2130.  Denies HI or auditory visual hallucinations.  She is also have multiple adjustments in her home medications over the last few months.  Poison control was contacted by charge RN.  Labs were reviewed and  independently interpreted by me.  AST is minimally elevated at 51.  She has a thrombocytosis of 514.  UDS is positive for amphetamines and benzodiazepines, which are consistent with her home medications.  Unfortunately, Tylenol level was not collected prior to the patient eloping from the emergency department.  Initially, patient was agreeable to work-up and plan in speaking with TTS regarding her symptoms after she was medically cleared.  Discussed that she would also need to be IVC need if she was not compliant with work up given intent of overdose and endorsing "not wanting to go on living like this."  She did also endorse alcohol use and taking multiple doses of her home Ativan earlier in the day.   IVC process was initiated. Unfortunately, I was notified by RN that patient had eloped from the department. Patient was discussed with Dr. Adela LankFloyd, attending physician. Authorities were notified and attempts were made to apprehend the patient after her elopement. However, patient had not returned to the ER at the conclusion of my shift.   Poison control was notified of patient's elopement and requested a call back if the patient were to return to the emergency department as patient was not medically cleared from overdose at this time.  Final Clinical Impression(s) / ED Diagnoses Final diagnoses:  Intentional drug overdose, initial encounter [facility](HCC)    Rx / DC Orders ED Discharge Orders    None       Neira Bentsen A, PA-C 01/06/20 0648    Melene PlanFloyd, Dan, DO 01/06/20 [id number]

## 2020-01-06 NOTE — ED Notes (Addendum)
At this time, pt returned from the restroom and RN provided patient with scrubs to change into and gave patient time to change. RN entered the room ~ 5 minutes later and patient was still in her regular clothing and packing her bag. RN asked patient why she hadn't changed yet and patient refused to change, stating "why do I need to change if im not staying here?". RN informed patient she would be staying due to her intentional overdose and that if she left she would be IVC'd. Pt then requested to use the restroom, even though she had just finished using the restroom prior to this interaction. RN told patient she couldn't go to the bathroom. Pt proceeded to walk out of the room. RN instructed pt to come back to the room, but patient continued to walk out of ED. Security, MD Adela Lank, and [place] Mia notified at this time of patient actions. Pt still had 18G IV in her LAC when she left.

## 2020-01-07 DIAGNOSIS — F332 Major depressive disorder, recurrent severe without psychotic features: Secondary | ICD-10-CM | POA: Diagnosis not present

## 2020-01-07 DIAGNOSIS — F1320 Sedative, hypnotic or anxiolytic dependence, uncomplicated: Secondary | ICD-10-CM | POA: Diagnosis not present

## 2020-01-07 DIAGNOSIS — F1520 Other stimulant dependence, uncomplicated: Secondary | ICD-10-CM | POA: Diagnosis not present

## 2020-01-07 DIAGNOSIS — F1121 Opioid dependence, in remission: Secondary | ICD-10-CM | POA: Diagnosis not present

## 2020-01-08 DIAGNOSIS — F1320 Sedative, hypnotic or anxiolytic dependence, uncomplicated: Secondary | ICD-10-CM | POA: Diagnosis not present

## 2020-01-08 DIAGNOSIS — F1121 Opioid dependence, in remission: Secondary | ICD-10-CM | POA: Diagnosis not present

## 2020-01-08 DIAGNOSIS — F1520 Other stimulant dependence, uncomplicated: Secondary | ICD-10-CM | POA: Diagnosis not present

## 2020-01-08 DIAGNOSIS — F332 Major depressive disorder, recurrent severe without psychotic features: Secondary | ICD-10-CM | POA: Diagnosis not present

## 2020-01-09 DIAGNOSIS — F1121 Opioid dependence, in remission: Secondary | ICD-10-CM | POA: Diagnosis not present

## 2020-01-09 DIAGNOSIS — F1520 Other stimulant dependence, uncomplicated: Secondary | ICD-10-CM | POA: Diagnosis not present

## 2020-01-09 DIAGNOSIS — F332 Major depressive disorder, recurrent severe without psychotic features: Secondary | ICD-10-CM | POA: Diagnosis not present

## 2020-01-09 DIAGNOSIS — F1320 Sedative, hypnotic or anxiolytic dependence, uncomplicated: Secondary | ICD-10-CM | POA: Diagnosis not present

## 2020-01-13 DIAGNOSIS — N760 Acute vaginitis: Secondary | ICD-10-CM | POA: Diagnosis not present

## 2020-01-13 DIAGNOSIS — M7592 Shoulder lesion, unspecified, left shoulder: Secondary | ICD-10-CM | POA: Diagnosis not present

## 2020-01-13 DIAGNOSIS — N6459 Other signs and symptoms in breast: Secondary | ICD-10-CM | POA: Diagnosis not present

## 2020-01-14 DIAGNOSIS — F1121 Opioid dependence, in remission: Secondary | ICD-10-CM | POA: Diagnosis not present

## 2020-01-14 DIAGNOSIS — F1320 Sedative, hypnotic or anxiolytic dependence, uncomplicated: Secondary | ICD-10-CM | POA: Diagnosis not present

## 2020-01-14 DIAGNOSIS — F332 Major depressive disorder, recurrent severe without psychotic features: Secondary | ICD-10-CM | POA: Diagnosis not present

## 2020-01-14 DIAGNOSIS — F1520 Other stimulant dependence, uncomplicated: Secondary | ICD-10-CM | POA: Diagnosis not present

## 2020-01-16 DIAGNOSIS — R002 Palpitations: Secondary | ICD-10-CM | POA: Diagnosis not present

## 2020-01-16 DIAGNOSIS — F17200 Nicotine dependence, unspecified, uncomplicated: Secondary | ICD-10-CM | POA: Diagnosis not present

## 2020-01-16 DIAGNOSIS — R631 Polydipsia: Secondary | ICD-10-CM | POA: Diagnosis not present

## 2020-01-16 DIAGNOSIS — R0602 Shortness of breath: Secondary | ICD-10-CM | POA: Diagnosis not present

## 2020-01-16 DIAGNOSIS — R3589 Other polyuria: Secondary | ICD-10-CM | POA: Diagnosis not present

## 2020-01-19 DIAGNOSIS — N760 Acute vaginitis: Secondary | ICD-10-CM | POA: Diagnosis not present

## 2020-01-21 DIAGNOSIS — F1121 Opioid dependence, in remission: Secondary | ICD-10-CM | POA: Diagnosis not present

## 2020-01-21 DIAGNOSIS — F1320 Sedative, hypnotic or anxiolytic dependence, uncomplicated: Secondary | ICD-10-CM | POA: Diagnosis not present

## 2020-01-21 DIAGNOSIS — F332 Major depressive disorder, recurrent severe without psychotic features: Secondary | ICD-10-CM | POA: Diagnosis not present

## 2020-01-21 DIAGNOSIS — F1520 Other stimulant dependence, uncomplicated: Secondary | ICD-10-CM | POA: Diagnosis not present

## 2020-01-26 DIAGNOSIS — M7592 Shoulder lesion, unspecified, left shoulder: Secondary | ICD-10-CM | POA: Diagnosis not present

## 2020-01-26 DIAGNOSIS — N6459 Other signs and symptoms in breast: Secondary | ICD-10-CM | POA: Diagnosis not present

## 2020-02-11 DIAGNOSIS — F902 Attention-deficit hyperactivity disorder, combined type: Secondary | ICD-10-CM | POA: Diagnosis not present

## 2020-02-11 DIAGNOSIS — Z79891 Long term (current) use of opiate analgesic: Secondary | ICD-10-CM | POA: Diagnosis not present

## 2020-02-11 DIAGNOSIS — F411 Generalized anxiety disorder: Secondary | ICD-10-CM | POA: Diagnosis not present

## 2020-02-11 DIAGNOSIS — F1121 Opioid dependence, in remission: Secondary | ICD-10-CM | POA: Diagnosis not present

## 2020-02-11 DIAGNOSIS — F331 Major depressive disorder, recurrent, moderate: Secondary | ICD-10-CM | POA: Diagnosis not present

## 2020-02-13 ENCOUNTER — Other Ambulatory Visit: Payer: BC Managed Care – PPO

## 2020-02-13 DIAGNOSIS — Z20822 Contact with and (suspected) exposure to covid-19: Secondary | ICD-10-CM

## 2020-02-16 LAB — NOVEL CORONAVIRUS, NAA: SARS-CoV-2, NAA: DETECTED — AB

## 2020-02-22 DIAGNOSIS — R21 Rash and other nonspecific skin eruption: Secondary | ICD-10-CM | POA: Diagnosis not present

## 2020-02-22 DIAGNOSIS — R61 Generalized hyperhidrosis: Secondary | ICD-10-CM | POA: Diagnosis not present

## 2020-02-22 DIAGNOSIS — R3989 Other symptoms and signs involving the genitourinary system: Secondary | ICD-10-CM | POA: Diagnosis not present

## 2020-03-02 DIAGNOSIS — F331 Major depressive disorder, recurrent, moderate: Secondary | ICD-10-CM | POA: Diagnosis not present

## 2020-03-02 DIAGNOSIS — F902 Attention-deficit hyperactivity disorder, combined type: Secondary | ICD-10-CM | POA: Diagnosis not present

## 2020-03-02 DIAGNOSIS — F411 Generalized anxiety disorder: Secondary | ICD-10-CM | POA: Diagnosis not present

## 2020-03-03 DIAGNOSIS — F1121 Opioid dependence, in remission: Secondary | ICD-10-CM | POA: Diagnosis not present

## 2020-03-03 DIAGNOSIS — Z79891 Long term (current) use of opiate analgesic: Secondary | ICD-10-CM | POA: Diagnosis not present

## 2020-03-28 DIAGNOSIS — R350 Frequency of micturition: Secondary | ICD-10-CM | POA: Diagnosis not present

## 2020-03-28 DIAGNOSIS — R3911 Hesitancy of micturition: Secondary | ICD-10-CM | POA: Diagnosis not present

## 2020-03-28 DIAGNOSIS — R319 Hematuria, unspecified: Secondary | ICD-10-CM | POA: Diagnosis not present

## 2020-03-28 DIAGNOSIS — R102 Pelvic and perineal pain: Secondary | ICD-10-CM | POA: Diagnosis not present

## 2020-03-30 DIAGNOSIS — F331 Major depressive disorder, recurrent, moderate: Secondary | ICD-10-CM | POA: Diagnosis not present

## 2020-03-30 DIAGNOSIS — F320 Major depressive disorder, single episode, mild: Secondary | ICD-10-CM | POA: Diagnosis not present

## 2020-03-30 DIAGNOSIS — F1121 Opioid dependence, in remission: Secondary | ICD-10-CM | POA: Diagnosis not present

## 2020-03-30 DIAGNOSIS — F411 Generalized anxiety disorder: Secondary | ICD-10-CM | POA: Diagnosis not present

## 2020-03-30 DIAGNOSIS — F902 Attention-deficit hyperactivity disorder, combined type: Secondary | ICD-10-CM | POA: Diagnosis not present

## 2020-03-31 DIAGNOSIS — Z79891 Long term (current) use of opiate analgesic: Secondary | ICD-10-CM | POA: Diagnosis not present

## 2020-03-31 DIAGNOSIS — F1121 Opioid dependence, in remission: Secondary | ICD-10-CM | POA: Diagnosis not present

## 2020-04-05 DIAGNOSIS — R3911 Hesitancy of micturition: Secondary | ICD-10-CM | POA: Diagnosis not present

## 2020-04-05 DIAGNOSIS — Z87448 Personal history of other diseases of urinary system: Secondary | ICD-10-CM | POA: Diagnosis not present

## 2020-04-05 DIAGNOSIS — R350 Frequency of micturition: Secondary | ICD-10-CM | POA: Diagnosis not present

## 2020-04-05 DIAGNOSIS — R102 Pelvic and perineal pain: Secondary | ICD-10-CM | POA: Diagnosis not present

## 2020-04-27 DIAGNOSIS — F1121 Opioid dependence, in remission: Secondary | ICD-10-CM | POA: Diagnosis not present

## 2020-04-27 DIAGNOSIS — F902 Attention-deficit hyperactivity disorder, combined type: Secondary | ICD-10-CM | POA: Diagnosis not present

## 2020-04-27 DIAGNOSIS — F331 Major depressive disorder, recurrent, moderate: Secondary | ICD-10-CM | POA: Diagnosis not present

## 2020-04-27 DIAGNOSIS — F1120 Opioid dependence, uncomplicated: Secondary | ICD-10-CM | POA: Diagnosis not present

## 2020-04-27 DIAGNOSIS — F411 Generalized anxiety disorder: Secondary | ICD-10-CM | POA: Diagnosis not present

## 2020-04-27 DIAGNOSIS — F320 Major depressive disorder, single episode, mild: Secondary | ICD-10-CM | POA: Diagnosis not present

## 2020-05-25 DIAGNOSIS — F331 Major depressive disorder, recurrent, moderate: Secondary | ICD-10-CM | POA: Diagnosis not present

## 2020-05-25 DIAGNOSIS — F902 Attention-deficit hyperactivity disorder, combined type: Secondary | ICD-10-CM | POA: Diagnosis not present

## 2020-05-25 DIAGNOSIS — F1121 Opioid dependence, in remission: Secondary | ICD-10-CM | POA: Diagnosis not present

## 2020-05-25 DIAGNOSIS — F1120 Opioid dependence, uncomplicated: Secondary | ICD-10-CM | POA: Diagnosis not present

## 2020-05-25 DIAGNOSIS — F320 Major depressive disorder, single episode, mild: Secondary | ICD-10-CM | POA: Diagnosis not present

## 2020-05-25 DIAGNOSIS — F411 Generalized anxiety disorder: Secondary | ICD-10-CM | POA: Diagnosis not present

## 2020-06-22 DIAGNOSIS — F1120 Opioid dependence, uncomplicated: Secondary | ICD-10-CM | POA: Diagnosis not present

## 2020-06-22 DIAGNOSIS — F411 Generalized anxiety disorder: Secondary | ICD-10-CM | POA: Diagnosis not present

## 2020-06-22 DIAGNOSIS — F902 Attention-deficit hyperactivity disorder, combined type: Secondary | ICD-10-CM | POA: Diagnosis not present

## 2020-06-22 DIAGNOSIS — F331 Major depressive disorder, recurrent, moderate: Secondary | ICD-10-CM | POA: Diagnosis not present

## 2020-06-29 DIAGNOSIS — F320 Major depressive disorder, single episode, mild: Secondary | ICD-10-CM | POA: Diagnosis not present

## 2020-06-29 DIAGNOSIS — F411 Generalized anxiety disorder: Secondary | ICD-10-CM | POA: Diagnosis not present

## 2020-06-29 DIAGNOSIS — F1121 Opioid dependence, in remission: Secondary | ICD-10-CM | POA: Diagnosis not present

## 2020-07-20 DIAGNOSIS — F331 Major depressive disorder, recurrent, moderate: Secondary | ICD-10-CM | POA: Diagnosis not present

## 2020-07-20 DIAGNOSIS — F411 Generalized anxiety disorder: Secondary | ICD-10-CM | POA: Diagnosis not present

## 2020-07-20 DIAGNOSIS — F1120 Opioid dependence, uncomplicated: Secondary | ICD-10-CM | POA: Diagnosis not present

## 2020-07-20 DIAGNOSIS — F902 Attention-deficit hyperactivity disorder, combined type: Secondary | ICD-10-CM | POA: Diagnosis not present

## 2020-07-27 DIAGNOSIS — F1121 Opioid dependence, in remission: Secondary | ICD-10-CM | POA: Diagnosis not present

## 2020-08-03 DIAGNOSIS — R21 Rash and other nonspecific skin eruption: Secondary | ICD-10-CM | POA: Diagnosis not present

## 2020-08-03 DIAGNOSIS — E669 Obesity, unspecified: Secondary | ICD-10-CM | POA: Diagnosis not present

## 2020-08-04 DIAGNOSIS — F411 Generalized anxiety disorder: Secondary | ICD-10-CM | POA: Diagnosis not present

## 2020-08-04 DIAGNOSIS — F1120 Opioid dependence, uncomplicated: Secondary | ICD-10-CM | POA: Diagnosis not present

## 2020-08-04 DIAGNOSIS — F331 Major depressive disorder, recurrent, moderate: Secondary | ICD-10-CM | POA: Diagnosis not present

## 2020-08-04 DIAGNOSIS — F902 Attention-deficit hyperactivity disorder, combined type: Secondary | ICD-10-CM | POA: Diagnosis not present

## 2020-08-24 DIAGNOSIS — F1121 Opioid dependence, in remission: Secondary | ICD-10-CM | POA: Diagnosis not present

## 2020-09-01 DIAGNOSIS — F1120 Opioid dependence, uncomplicated: Secondary | ICD-10-CM | POA: Diagnosis not present

## 2020-09-01 DIAGNOSIS — F331 Major depressive disorder, recurrent, moderate: Secondary | ICD-10-CM | POA: Diagnosis not present

## 2020-09-01 DIAGNOSIS — F902 Attention-deficit hyperactivity disorder, combined type: Secondary | ICD-10-CM | POA: Diagnosis not present

## 2020-09-01 DIAGNOSIS — F411 Generalized anxiety disorder: Secondary | ICD-10-CM | POA: Diagnosis not present

## 2020-09-26 DIAGNOSIS — F1121 Opioid dependence, in remission: Secondary | ICD-10-CM | POA: Diagnosis not present

## 2020-09-27 DIAGNOSIS — F320 Major depressive disorder, single episode, mild: Secondary | ICD-10-CM | POA: Diagnosis not present

## 2020-09-27 DIAGNOSIS — F1121 Opioid dependence, in remission: Secondary | ICD-10-CM | POA: Diagnosis not present

## 2020-09-27 DIAGNOSIS — F331 Major depressive disorder, recurrent, moderate: Secondary | ICD-10-CM | POA: Diagnosis not present

## 2020-09-27 DIAGNOSIS — Z7151 Drug abuse counseling and surveillance of drug abuser: Secondary | ICD-10-CM | POA: Diagnosis not present

## 2020-09-27 DIAGNOSIS — F902 Attention-deficit hyperactivity disorder, combined type: Secondary | ICD-10-CM | POA: Diagnosis not present

## 2020-09-27 DIAGNOSIS — F1120 Opioid dependence, uncomplicated: Secondary | ICD-10-CM | POA: Diagnosis not present

## 2020-09-27 DIAGNOSIS — F411 Generalized anxiety disorder: Secondary | ICD-10-CM | POA: Diagnosis not present

## 2020-10-26 DIAGNOSIS — F1121 Opioid dependence, in remission: Secondary | ICD-10-CM | POA: Diagnosis not present

## 2020-10-27 DIAGNOSIS — F902 Attention-deficit hyperactivity disorder, combined type: Secondary | ICD-10-CM | POA: Diagnosis not present

## 2020-10-27 DIAGNOSIS — F1120 Opioid dependence, uncomplicated: Secondary | ICD-10-CM | POA: Diagnosis not present

## 2020-10-27 DIAGNOSIS — F331 Major depressive disorder, recurrent, moderate: Secondary | ICD-10-CM | POA: Diagnosis not present

## 2020-10-27 DIAGNOSIS — F411 Generalized anxiety disorder: Secondary | ICD-10-CM | POA: Diagnosis not present

## 2020-11-02 DIAGNOSIS — F411 Generalized anxiety disorder: Secondary | ICD-10-CM | POA: Diagnosis not present

## 2020-11-02 DIAGNOSIS — F320 Major depressive disorder, single episode, mild: Secondary | ICD-10-CM | POA: Diagnosis not present

## 2020-11-02 DIAGNOSIS — F1121 Opioid dependence, in remission: Secondary | ICD-10-CM | POA: Diagnosis not present

## 2020-11-24 DIAGNOSIS — F331 Major depressive disorder, recurrent, moderate: Secondary | ICD-10-CM | POA: Diagnosis not present

## 2020-11-24 DIAGNOSIS — F1120 Opioid dependence, uncomplicated: Secondary | ICD-10-CM | POA: Diagnosis not present

## 2020-11-24 DIAGNOSIS — F411 Generalized anxiety disorder: Secondary | ICD-10-CM | POA: Diagnosis not present

## 2020-11-24 DIAGNOSIS — F902 Attention-deficit hyperactivity disorder, combined type: Secondary | ICD-10-CM | POA: Diagnosis not present

## 2020-11-30 DIAGNOSIS — Z789 Other specified health status: Secondary | ICD-10-CM | POA: Diagnosis not present

## 2020-11-30 DIAGNOSIS — D225 Melanocytic nevi of trunk: Secondary | ICD-10-CM | POA: Diagnosis not present

## 2020-11-30 DIAGNOSIS — L858 Other specified epidermal thickening: Secondary | ICD-10-CM | POA: Diagnosis not present

## 2020-11-30 DIAGNOSIS — R208 Other disturbances of skin sensation: Secondary | ICD-10-CM | POA: Diagnosis not present

## 2020-11-30 DIAGNOSIS — L989 Disorder of the skin and subcutaneous tissue, unspecified: Secondary | ICD-10-CM | POA: Diagnosis not present

## 2020-11-30 DIAGNOSIS — L814 Other melanin hyperpigmentation: Secondary | ICD-10-CM | POA: Diagnosis not present

## 2020-11-30 DIAGNOSIS — D2371 Other benign neoplasm of skin of right lower limb, including hip: Secondary | ICD-10-CM | POA: Diagnosis not present

## 2020-11-30 DIAGNOSIS — D485 Neoplasm of uncertain behavior of skin: Secondary | ICD-10-CM | POA: Diagnosis not present

## 2020-11-30 DIAGNOSIS — L538 Other specified erythematous conditions: Secondary | ICD-10-CM | POA: Diagnosis not present

## 2020-11-30 DIAGNOSIS — L03012 Cellulitis of left finger: Secondary | ICD-10-CM | POA: Diagnosis not present

## 2020-12-19 DIAGNOSIS — D235 Other benign neoplasm of skin of trunk: Secondary | ICD-10-CM | POA: Diagnosis not present

## 2020-12-19 DIAGNOSIS — D2361 Other benign neoplasm of skin of right upper limb, including shoulder: Secondary | ICD-10-CM | POA: Diagnosis not present

## 2020-12-20 DIAGNOSIS — F902 Attention-deficit hyperactivity disorder, combined type: Secondary | ICD-10-CM | POA: Diagnosis not present

## 2020-12-20 DIAGNOSIS — F331 Major depressive disorder, recurrent, moderate: Secondary | ICD-10-CM | POA: Diagnosis not present

## 2020-12-20 DIAGNOSIS — F1120 Opioid dependence, uncomplicated: Secondary | ICD-10-CM | POA: Diagnosis not present

## 2020-12-20 DIAGNOSIS — F411 Generalized anxiety disorder: Secondary | ICD-10-CM | POA: Diagnosis not present

## 2020-12-28 DIAGNOSIS — F1121 Opioid dependence, in remission: Secondary | ICD-10-CM | POA: Diagnosis not present

## 2021-01-16 DIAGNOSIS — D235 Other benign neoplasm of skin of trunk: Secondary | ICD-10-CM | POA: Diagnosis not present

## 2021-05-15 DIAGNOSIS — F902 Attention-deficit hyperactivity disorder, combined type: Secondary | ICD-10-CM | POA: Diagnosis not present

## 2021-05-15 DIAGNOSIS — F411 Generalized anxiety disorder: Secondary | ICD-10-CM | POA: Diagnosis not present

## 2021-05-15 DIAGNOSIS — F331 Major depressive disorder, recurrent, moderate: Secondary | ICD-10-CM | POA: Diagnosis not present

## 2021-05-23 DIAGNOSIS — Z91411 Personal history of adult psychological abuse: Secondary | ICD-10-CM | POA: Diagnosis not present

## 2021-05-23 DIAGNOSIS — F341 Dysthymic disorder: Secondary | ICD-10-CM | POA: Diagnosis not present

## 2021-05-23 DIAGNOSIS — Z638 Other specified problems related to primary support group: Secondary | ICD-10-CM | POA: Diagnosis not present

## 2021-05-23 DIAGNOSIS — Z91410 Personal history of adult physical and sexual abuse: Secondary | ICD-10-CM | POA: Diagnosis not present

## 2021-06-07 DIAGNOSIS — F331 Major depressive disorder, recurrent, moderate: Secondary | ICD-10-CM | POA: Diagnosis not present

## 2021-06-07 DIAGNOSIS — F902 Attention-deficit hyperactivity disorder, combined type: Secondary | ICD-10-CM | POA: Diagnosis not present

## 2021-06-07 DIAGNOSIS — F411 Generalized anxiety disorder: Secondary | ICD-10-CM | POA: Diagnosis not present
